# Patient Record
Sex: Male | Born: 1980
Health system: Southern US, Community
[De-identification: ages and names within clinical notes are randomized; demographics above are authoritative.]

## PROBLEM LIST (undated history)

## (undated) DIAGNOSIS — E782 Mixed hyperlipidemia: Secondary | ICD-10-CM

## (undated) DIAGNOSIS — Z6820 Body mass index (BMI) 20.0-20.9, adult: Secondary | ICD-10-CM

## (undated) DIAGNOSIS — F4321 Adjustment disorder with depressed mood: Secondary | ICD-10-CM

## (undated) DIAGNOSIS — F17200 Nicotine dependence, unspecified, uncomplicated: Secondary | ICD-10-CM

## (undated) DIAGNOSIS — I10 Essential (primary) hypertension: Secondary | ICD-10-CM

## (undated) DIAGNOSIS — F411 Generalized anxiety disorder: Secondary | ICD-10-CM

## (undated) HISTORY — DX: Mixed hyperlipidemia: E78.2

## (undated) HISTORY — DX: Generalized anxiety disorder: F41.1

## (undated) HISTORY — DX: Essential (primary) hypertension: I10

## (undated) HISTORY — PX: OTHER SURGICAL HISTORY: SHX169

## (undated) HISTORY — DX: Nicotine dependence, unspecified, uncomplicated: F17.200

## (undated) HISTORY — DX: Adjustment disorder with depressed mood: F43.21

## (undated) HISTORY — DX: Body mass index (BMI) 20.0-20.9, adult: Z68.20

---

## 2002-10-09 ENCOUNTER — Encounter: Admission: RE | Admit: 2002-10-09 | Discharge: 2002-10-09 | Payer: Self-pay | Admitting: Psychiatry

## 2002-10-28 ENCOUNTER — Encounter: Admission: RE | Admit: 2002-10-28 | Discharge: 2002-10-28 | Payer: Self-pay | Admitting: Psychiatry

## 2005-10-26 ENCOUNTER — Encounter: Admission: RE | Admit: 2005-10-26 | Discharge: 2005-10-26 | Payer: Self-pay | Admitting: Emergency Medicine

## 2006-05-16 DIAGNOSIS — K5792 Diverticulitis of intestine, part unspecified, without perforation or abscess without bleeding: Secondary | ICD-10-CM

## 2006-05-16 HISTORY — DX: Diverticulitis of intestine, part unspecified, without perforation or abscess without bleeding: K57.92

## 2014-05-08 ENCOUNTER — Inpatient Hospital Stay (HOSPITAL_COMMUNITY)
Admission: EM | Admit: 2014-05-08 | Discharge: 2014-05-10 | DRG: 872 | Disposition: A | Discharge status: Home / Self Care | Payer: PRIVATE HEALTH INSURANCE | Attending: Internal Medicine | Admitting: Internal Medicine

## 2014-05-08 ENCOUNTER — Emergency Department (HOSPITAL_COMMUNITY): Payer: PRIVATE HEALTH INSURANCE

## 2014-05-08 ENCOUNTER — Encounter (HOSPITAL_COMMUNITY): Payer: Self-pay | Admitting: *Deleted

## 2014-05-08 DIAGNOSIS — I471 Supraventricular tachycardia: Secondary | ICD-10-CM | POA: Diagnosis present

## 2014-05-08 DIAGNOSIS — R509 Fever, unspecified: Secondary | ICD-10-CM

## 2014-05-08 DIAGNOSIS — F17220 Nicotine dependence, chewing tobacco, uncomplicated: Secondary | ICD-10-CM | POA: Diagnosis present

## 2014-05-08 DIAGNOSIS — A419 Sepsis, unspecified organism: Secondary | ICD-10-CM | POA: Diagnosis present

## 2014-05-08 DIAGNOSIS — R112 Nausea with vomiting, unspecified: Secondary | ICD-10-CM | POA: Diagnosis present

## 2014-05-08 DIAGNOSIS — R109 Unspecified abdominal pain: Secondary | ICD-10-CM | POA: Insufficient documentation

## 2014-05-08 DIAGNOSIS — Z23 Encounter for immunization: Secondary | ICD-10-CM | POA: Diagnosis not present

## 2014-05-08 DIAGNOSIS — R000 Tachycardia, unspecified: Secondary | ICD-10-CM

## 2014-05-08 LAB — CBC WITH DIFFERENTIAL/PLATELET
BASOS ABS: 0 10*3/uL (ref 0.0–0.1)
BASOS PCT: 0 % (ref 0–1)
EOS PCT: 0 % (ref 0–5)
Eosinophils Absolute: 0 10*3/uL (ref 0.0–0.7)
HEMATOCRIT: 48.6 % (ref 39.0–52.0)
HEMOGLOBIN: 16.3 g/dL (ref 13.0–17.0)
LYMPHS PCT: 7 % — AB (ref 12–46)
Lymphs Abs: 0.7 10*3/uL (ref 0.7–4.0)
MCH: 28.8 pg (ref 26.0–34.0)
MCHC: 33.5 g/dL (ref 30.0–36.0)
MCV: 85.9 fL (ref 78.0–100.0)
MONO ABS: 0.4 10*3/uL (ref 0.1–1.0)
Monocytes Relative: 4 % (ref 3–12)
NEUTROS ABS: 9.3 10*3/uL — AB (ref 1.7–7.7)
NEUTROS PCT: 89 % — AB (ref 43–77)
PLATELETS: 166 10*3/uL (ref 150–400)
RBC: 5.66 MIL/uL (ref 4.22–5.81)
RDW: 13 % (ref 11.5–15.5)
WBC: 10.4 10*3/uL (ref 4.0–10.5)

## 2014-05-08 LAB — RAPID URINE DRUG SCREEN, HOSP PERFORMED
Amphetamines: NOT DETECTED
BARBITURATES: NOT DETECTED
BENZODIAZEPINES: NOT DETECTED
COCAINE: NOT DETECTED
OPIATES: NOT DETECTED
Tetrahydrocannabinol: NOT DETECTED

## 2014-05-08 LAB — COMPREHENSIVE METABOLIC PANEL
ALBUMIN: 3.8 g/dL (ref 3.5–5.2)
ALT: 28 U/L (ref 0–53)
ANION GAP: 8 (ref 5–15)
AST: 23 U/L (ref 0–37)
Alkaline Phosphatase: 61 U/L (ref 39–117)
BUN: 16 mg/dL (ref 6–23)
CALCIUM: 8.5 mg/dL (ref 8.4–10.5)
CHLORIDE: 105 meq/L (ref 96–112)
CO2: 26 mmol/L (ref 19–32)
Creatinine, Ser: 1.19 mg/dL (ref 0.50–1.35)
GFR, EST NON AFRICAN AMERICAN: 78 mL/min — AB (ref >90)
Glucose, Bld: 113 mg/dL — ABNORMAL HIGH (ref 70–99)
Potassium: 4.4 mmol/L (ref 3.5–5.1)
Sodium: 139 mmol/L (ref 135–145)
Total Bilirubin: 0.7 mg/dL (ref 0.3–1.2)
Total Protein: 6.7 g/dL (ref 6.0–8.3)

## 2014-05-08 LAB — URINALYSIS, ROUTINE W REFLEX MICROSCOPIC
Bilirubin Urine: NEGATIVE
Glucose, UA: NEGATIVE mg/dL
Hgb urine dipstick: NEGATIVE
KETONES UR: NEGATIVE mg/dL
LEUKOCYTES UA: NEGATIVE
NITRITE: NEGATIVE
PROTEIN: NEGATIVE mg/dL
Specific Gravity, Urine: 1.016 (ref 1.005–1.030)
UROBILINOGEN UA: 1 mg/dL (ref 0.0–1.0)
pH: 7.5 (ref 5.0–8.0)

## 2014-05-08 LAB — I-STAT CG4 LACTIC ACID, ED: Lactic Acid, Venous: 2.44 mmol/L — ABNORMAL HIGH (ref 0.5–2.2)

## 2014-05-08 LAB — MRSA PCR SCREENING: MRSA by PCR: NEGATIVE

## 2014-05-08 LAB — I-STAT TROPONIN, ED: TROPONIN I, POC: 0 ng/mL (ref 0.00–0.08)

## 2014-05-08 LAB — LIPASE, BLOOD: Lipase: 33 U/L (ref 11–59)

## 2014-05-08 LAB — TSH: TSH: 4.807 u[IU]/mL — AB (ref 0.350–4.500)

## 2014-05-08 LAB — LACTIC ACID, PLASMA: LACTIC ACID, VENOUS: 1.3 mmol/L (ref 0.5–2.2)

## 2014-05-08 LAB — T4, FREE: FREE T4: 1.12 ng/dL (ref 0.80–1.80)

## 2014-05-08 LAB — ETHANOL: Alcohol, Ethyl (B): <5 mg/dL (ref 0–9)

## 2014-05-08 MED ORDER — ACETAMINOPHEN 325 MG PO TABS
650.0000 mg | ORAL_TABLET | Freq: Four times a day (QID) | ORAL | Status: DC | PRN
  Administered 2014-05-08 – 2014-05-09 (×2): 650 mg via ORAL
  Filled 2014-05-08 (×2): qty 2

## 2014-05-08 MED ORDER — SODIUM CHLORIDE 0.9 % IV SOLN
INTRAVENOUS | Status: DC
  Administered 2014-05-08: 16:00:00 via INTRAVENOUS
  Administered 2014-05-09: 1000 mL via INTRAVENOUS
  Administered 2014-05-09 – 2014-05-10 (×2): via INTRAVENOUS

## 2014-05-08 MED ORDER — ONDANSETRON HCL 4 MG PO TABS: 4.0000 mg | ORAL_TABLET | Freq: Four times a day (QID) | ORAL | Status: DC | PRN

## 2014-05-08 MED ORDER — VANCOMYCIN HCL IN DEXTROSE 1-5 GM/200ML-% IV SOLN
1000.0000 mg | Freq: Two times a day (BID) | INTRAVENOUS | Status: DC
  Administered 2014-05-08: 1000 mg via INTRAVENOUS
  Filled 2014-05-08 (×3): qty 200

## 2014-05-08 MED ORDER — ONDANSETRON HCL 4 MG/2ML IJ SOLN
4.0000 mg | Freq: Once | INTRAMUSCULAR | Status: AC
  Administered 2014-05-08: 4 mg via INTRAVENOUS
  Filled 2014-05-08: qty 2

## 2014-05-08 MED ORDER — INFLUENZA VAC SPLIT QUAD 0.5 ML IM SUSY
0.5000 mL | PREFILLED_SYRINGE | INTRAMUSCULAR | Status: AC
Start: 2014-05-09 — End: 2014-05-09
  Administered 2014-05-09: 0.5 mL via INTRAMUSCULAR
  Filled 2014-05-08: qty 0.5

## 2014-05-08 MED ORDER — ACETAMINOPHEN 650 MG RE SUPP: 650.0000 mg | Freq: Four times a day (QID) | RECTAL | Status: DC | PRN

## 2014-05-08 MED ORDER — VANCOMYCIN HCL IN DEXTROSE 1-5 GM/200ML-% IV SOLN
1000.0000 mg | INTRAVENOUS | Status: AC
  Administered 2014-05-08: 1000 mg via INTRAVENOUS
  Filled 2014-05-08: qty 200

## 2014-05-08 MED ORDER — ONDANSETRON HCL 4 MG/2ML IJ SOLN
4.0000 mg | Freq: Four times a day (QID) | INTRAMUSCULAR | Status: DC | PRN
  Administered 2014-05-08: 4 mg via INTRAVENOUS
  Filled 2014-05-08: qty 2

## 2014-05-08 MED ORDER — OXYCODONE HCL 5 MG PO TABS
5.0000 mg | ORAL_TABLET | ORAL | Status: DC | PRN
  Administered 2014-05-10: 5 mg via ORAL
  Filled 2014-05-08: qty 1

## 2014-05-08 MED ORDER — IBUPROFEN 600 MG PO TABS
600.0000 mg | ORAL_TABLET | Freq: Four times a day (QID) | ORAL | Status: DC | PRN
  Administered 2014-05-08 – 2014-05-09 (×3): 600 mg via ORAL
  Filled 2014-05-08 (×6): qty 1

## 2014-05-08 MED ORDER — METOCLOPRAMIDE HCL 5 MG/ML IJ SOLN
10.0000 mg | Freq: Once | INTRAMUSCULAR | Status: AC
  Administered 2014-05-08: 10 mg via INTRAVENOUS
  Filled 2014-05-08: qty 2

## 2014-05-08 MED ORDER — IOHEXOL 350 MG/ML SOLN
100.0000 mL | Freq: Once | INTRAVENOUS | Status: AC | PRN
  Administered 2014-05-08: 100 mL via INTRAVENOUS

## 2014-05-08 MED ORDER — ALUM & MAG HYDROXIDE-SIMETH 200-200-20 MG/5ML PO SUSP: 30.0000 mL | Freq: Four times a day (QID) | ORAL | Status: DC | PRN

## 2014-05-08 MED ORDER — DEXTROSE 5 % IV SOLN
2.0000 g | INTRAVENOUS | Status: AC
  Administered 2014-05-08: 2 g via INTRAVENOUS
  Filled 2014-05-08: qty 2

## 2014-05-08 MED ORDER — SODIUM CHLORIDE 0.9 % IV BOLUS (SEPSIS)
1000.0000 mL | Freq: Once | INTRAVENOUS | Status: AC
  Administered 2014-05-08: 1000 mL via INTRAVENOUS

## 2014-05-08 MED ORDER — ACETAMINOPHEN 325 MG PO TABS
650.0000 mg | ORAL_TABLET | Freq: Once | ORAL | Status: AC
  Administered 2014-05-08: 650 mg via ORAL
  Filled 2014-05-08: qty 2

## 2014-05-08 MED ORDER — SODIUM CHLORIDE 0.9 % IV SOLN: INTRAVENOUS | Status: DC

## 2014-05-08 MED ORDER — ENOXAPARIN SODIUM 60 MG/0.6ML ~~LOC~~ SOLN
50.0000 mg | SUBCUTANEOUS | Status: DC
  Administered 2014-05-08 – 2014-05-09 (×2): 50 mg via SUBCUTANEOUS
  Filled 2014-05-08 (×3): qty 0.6

## 2014-05-08 MED ORDER — DEXTROSE 5 % IV SOLN
2.0000 g | Freq: Three times a day (TID) | INTRAVENOUS | Status: DC
  Administered 2014-05-08 – 2014-05-09 (×3): 2 g via INTRAVENOUS
  Filled 2014-05-08 (×5): qty 2

## 2014-05-08 NOTE — ED Notes (Signed)
Pt was given 4 mg Zofran. Nausea relieved.

## 2014-05-08 NOTE — Progress Notes (Signed)
Pt arrived to unit accompanied by RN. Pt oriented to room/unit. VS stable. 

## 2014-05-08 NOTE — H&P (Signed)
[facility] Admission History and Physical       Kenneth Hernandez ZOX:[id number] DOB: 07/11/1980 DOA: 05/08/2014  Referring physician: EDP PCP: No primary care provider on file.  Specialists:   Chief Complaint: Fever and Flank Pain with Nausea and Vomiting  HPI: Kenneth Hernandez is a 33 y.o. male who presents to the ED with complaints of Fever and Chills and Bilateral Flank PAin with nausea and Vomiting that started at 9 pm.  He denies any diarrhea.  His daughter subsequently became ill a few hours later .    His heart rate was found to be in the 140's , on Route to the ED he was administered adenosine x2, without improvement.  He   was administered IVF and Anti-Emetics in the ED and CT scan of ABD and Renal were performed and were negative for Renal stones or etiology.    Review of Systems:  Constitutional: No Weight Loss, No Weight Gain, Night Sweats, +Fevers, +Chills, Dizziness, Fatigue, or Generalized Weakness HEENT: No Headaches, Difficulty Swallowing,Tooth/Dental Problems,Sore Throat,  No Sneezing, Rhinitis, Ear Ache, Nasal Congestion, or Post Nasal Drip,  Cardio-vascular:  No Chest pain, Orthopnea, PND, Edema in Lower Extremities, Anasarca, Dizziness, Palpitations  Resp: No Dyspnea, No DOE, No Productive Cough, No Non-Productive Cough, No Hemoptysis, No Wheezing.    GI: No Heartburn, Indigestion, +Abdominal Pain,+ Nausea, +Vomiting, Diarrhea, Hematemesis, Hematochezia, Melena, Change in Bowel Habits,  Loss of Appetite  GU: No Dysuria, Change in Color of Urine, No Urgency or Frequency, No Flank pain.  Musculoskeletal: No Joint Pain or Swelling, No Decreased Range of Motion, No Back Pain.  Neurologic: No Syncope, No Seizures, Muscle Weakness, Paresthesia, Vision Disturbance or Loss, No Diplopia, No Vertigo, No Difficulty Walking,  Skin: No Rash or Lesions. Psych: No Change in Mood or Affect, No Depression or Anxiety, No Memory loss, No Confusion, or Hallucinations   History reviewed.  No pertinent past medical history.    History reviewed. No pertinent past surgical history.     Prior to Admission medications   Medication Sig Start Date End Date Taking? Authorizing Provider  anti-nausea (EMETROL) solution Take 10 mLs by mouth every 15 (fifteen) minutes as needed for nausea or vomiting.   Yes Historical Provider, MD  beclomethasone (QVAR) 40 MCG/ACT inhaler Inhale 1 puff into the lungs as needed (for wheezing).   Yes Historical Provider, MD  dimenhyDRINATE (DRAMAMINE) 50 MG tablet Take 50 mg by mouth every 8 (eight) hours as needed for nausea.    Yes Historical Provider, MD  diphenhydrAMINE (BENADRYL) 25 MG tablet Take 25 mg by mouth every 6 (six) hours as needed for allergies.   Yes Historical Provider, MD      Allergies  Allergen Reactions  . Morphine Nausea And Vomiting  . Penicillins Other (See Comments)    unknown     Social History:  reports that he has never smoked. His smokeless tobacco use includes Chew. He reports that he drinks alcohol. He reports that he does not use illicit drugs.     History reviewed. No pertinent family history.     Physical Exam:  GEN:  Pleasant  33 y.o. male  examined  and in no acute distress; cooperative with exam Filed Vitals:   05/08/14 0715 05/08/14 0719 05/08/14 0730 05/08/14 0745  BP: 127/70 127/70 117/76 120/73  Pulse: 124 118 126 115  Temp:      TempSrc:      Resp: 24 22 21 24   Height:      Weight:  SpO2: 96% 94% 95% 95%   Blood pressure 120/73, pulse 115, temperature 101.4 F (38.6 C), temperature source Oral, resp. rate 24, height 5\' 10"  (1.778 m), weight 106.595 kg (235 lb), SpO2 95 %. PSYCH: He is alert and oriented x4; does not appear anxious does not appear depressed; affect is normal HEENT: Normocephalic and Atraumatic, Mucous membranes pink; PERRLA; EOM intact; Fundi:  Benign;  No scleral icterus, Nares: Patent, Oropharynx: Clear, Fair Dentition,    Neck:  FROM, No Cervical Lymphadenopathy nor  Thyromegaly or Carotid Bruit; No JVD; Breasts:: Not examined CHEST WALL: No tenderness CHEST: Normal respiration, clear to auscultation bilaterally HEART: Tachycardic Regular Rhythm, no murmurs rubs or gallops BACK: No kyphosis or scoliosis; No CVA tenderness ABDOMEN: Positive Bowel Sounds,  Soft Non-Tender; No Masses, No Organomegaly, No Pannus; No Intertriginous candida. Rectal Exam: Not done EXTREMITIES: No Cyanosis, Clubbing, or Edema; No Ulcerations. Genitalia: not examined PULSES: 2+ and symmetric SKIN: Normal hydration no rash or ulceration CNS:  NonFocal Vascular: pulses palpable throughout    Labs on Admission:  Basic Metabolic Panel:  Recent Labs Lab 05/08/14 0043  NA 139  K 4.4  CL 105  CO2 26  GLUCOSE 113*  BUN 16  CREATININE 1.19  CALCIUM 8.5   Liver Function Tests:  Recent Labs Lab 05/08/14 0043  AST 23  ALT 28  ALKPHOS 61  BILITOT 0.7  PROT 6.7  ALBUMIN 3.8    Recent Labs Lab 05/08/14 0043  LIPASE 33   No results for input(s): AMMONIA in the last 168 hours. CBC:  Recent Labs Lab 05/08/14 0043  WBC 10.4  NEUTROABS 9.3*  HGB 16.3  HCT 48.6  MCV 85.9  PLT 166   Cardiac Enzymes: No results for input(s): CKTOTAL, CKMB, CKMBINDEX, TROPONINI in the last 168 hours.  BNP (last 3 results) No results for input(s): PROBNP in the last 8760 hours. CBG: No results for input(s): GLUCAP in the last 168 hours.  Radiological Exams on Admission: Ct Angio Chest Pe W/cm &/or Wo Cm  05/08/2014   CLINICAL DATA:  Acute onset of nausea and vomiting. Supraventricular tachycardia. Bilateral lower back pain and fever. Initial encounter.  EXAM: CT ANGIOGRAPHY CHEST  CT ABDOMEN AND PELVIS WITHOUT CONTRAST  TECHNIQUE: Multidetector CT imaging of the chest was performed using the standard protocol during bolus administration of intravenous contrast. Multiplanar CT image reconstructions and MIPs were obtained to evaluate the vascular anatomy. Multidetector CT  imaging of the abdomen and pelvis was performed using the standard protocol, prior to the administration of intravenous contrast.  CONTRAST:  OMNIPAQUE IOHEXOL 350 MG/ML SOLN  COMPARISON:  CT of the abdomen and pelvis from 01/14/2013, and chest radiograph performed earlier today at 12:39 a.m.  FINDINGS: CTA CHEST FINDINGS  There is no evidence of pulmonary embolus.  The lungs are essentially clear bilaterally. There is no evidence of significant focal consolidation, pleural effusion or pneumothorax. No masses are identified; no abnormal focal contrast enhancement is seen.  The mediastinum is unremarkable in appearance. No mediastinal lymphadenopathy is seen. No pericardial effusion is identified. The great vessels are grossly unremarkable in appearance. No axillary lymphadenopathy is seen. The visualized portions of the thyroid gland are unremarkable in appearance.  No acute osseous abnormalities are seen.  CT ABDOMEN and PELVIS FINDINGS  The liver and spleen are unremarkable in appearance. The gallbladder is within normal limits. The pancreas and adrenal glands are unremarkable.  The kidneys are unremarkable in appearance. There is no evidence of hydronephrosis. No renal  or ureteral stones are seen. Minimal nonspecific perinephric stranding is noted bilaterally.  No free fluid is identified. The small bowel is unremarkable in appearance. The stomach is within normal limits. No acute vascular abnormalities are seen.  The appendix is normal in caliber and contains air, without evidence for appendicitis. The colon is unremarkable in appearance.  The bladder is mildly distended and grossly unremarkable. A small urachal remnant is incidentally seen. The prostate is normal in size. No inguinal lymphadenopathy is seen.  No acute osseous abnormalities are identified.  Review of the MIP images confirms the above findings.  IMPRESSION: 1. No evidence of pulmonary embolus. Lungs remain clear bilaterally. 2. Unremarkable  noncontrast CT of the abdomen and pelvis.   Electronically Signed   By: Roanna RaiderJeffery  Chang M.D.   On: 05/08/2014 02:11   Dg Chest Port 1 View  05/08/2014   CLINICAL DATA:  Acute onset of generalized chest pain and shortness of breath. Tachycardia. Initial encounter.  EXAM: PORTABLE CHEST - 1 VIEW  COMPARISON:  Chest radiograph performed 01/14/2013  FINDINGS: The lungs are well-aerated and clear. There is no evidence of focal opacification, pleural effusion or pneumothorax.  The cardiomediastinal silhouette is within normal limits. No acute osseous abnormalities are seen.  IMPRESSION: No acute cardiopulmonary process seen.   Electronically Signed   By: Roanna RaiderJeffery  Chang M.D.   On: 05/08/2014 01:13   Ct Renal Stone Study  05/08/2014   CLINICAL DATA:  Acute onset of nausea and vomiting. Supraventricular tachycardia. Bilateral lower back pain and fever. Initial encounter.  EXAM: CT ANGIOGRAPHY CHEST  CT ABDOMEN AND PELVIS WITHOUT CONTRAST  TECHNIQUE: Multidetector CT imaging of the chest was performed using the standard protocol during bolus administration of intravenous contrast. Multiplanar CT image reconstructions and MIPs were obtained to evaluate the vascular anatomy. Multidetector CT imaging of the abdomen and pelvis was performed using the standard protocol, prior to the administration of intravenous contrast.  CONTRAST:  100mL OMNIPAQUE IOHEXOL 350 MG/ML SOLN  COMPARISON:  CT of the abdomen and pelvis from 01/14/2013, and chest radiograph performed earlier today at 12:39 a.m.  FINDINGS: CTA CHEST FINDINGS  There is no evidence of pulmonary embolus.  The lungs are essentially clear bilaterally. There is no evidence of significant focal consolidation, pleural effusion or pneumothorax. No masses are identified; no abnormal focal contrast enhancement is seen.  The mediastinum is unremarkable in appearance. No mediastinal lymphadenopathy is seen. No pericardial effusion is identified. The great vessels are grossly  unremarkable in appearance. No axillary lymphadenopathy is seen. The visualized portions of the thyroid gland are unremarkable in appearance.  No acute osseous abnormalities are seen.  CT ABDOMEN and PELVIS FINDINGS  The liver and spleen are unremarkable in appearance. The gallbladder is within normal limits. The pancreas and adrenal glands are unremarkable.  The kidneys are unremarkable in appearance. There is no evidence of hydronephrosis. No renal or ureteral stones are seen. Minimal nonspecific perinephric stranding is noted bilaterally.  No free fluid is identified. The small bowel is unremarkable in appearance. The stomach is within normal limits. No acute vascular abnormalities are seen.  The appendix is normal in caliber and contains air, without evidence for appendicitis. The colon is unremarkable in appearance.  The bladder is mildly distended and grossly unremarkable. A small urachal remnant is incidentally seen. The prostate is normal in size. No inguinal lymphadenopathy is seen.  No acute osseous abnormalities are identified.  Review of the MIP images confirms the above findings.  IMPRESSION: 1. No  evidence of pulmonary embolus. Lungs remain clear bilaterally. 2. Unremarkable noncontrast CT of the abdomen and pelvis.   Electronically Signed   By: Roanna Raider M.D.   On: 05/08/2014 02:11     EKG: Independently reviewed. Sinus Tachycardia at 145   Assessment/Plan:   33 y.o. male with  Principal Problem:   1.    Sepsis/ Febrile illness   Blood Cultures x2, Urine Cx sent   IV Vancomycin and Cefepime   IVFs      Active Problems:   2.   Sinus tachycardia- Due to Sepsis   Check TSH   IVFs      3.   Nausea and vomiting   PRN Anti-Emetics           Code Status:  FULL CODE     Family Communication:  No Family Present    Disposition Plan:    Inpatient  Time spent:   58 Minutes  Ron Parker [facility] Pager [phone]   If 7AM -7PM Please Contact the Day  Rounding Team MD for [facility]  If 7PM-7AM, Please Contact Night-Floor Coverage  [email or web address] Password [email or web address] 05/08/2014, 8:14 AM

## 2014-05-08 NOTE — ED Notes (Signed)
Patient transported to CT 

## 2014-05-08 NOTE — ED Provider Notes (Signed)
CSN: [id number]     Arrival date & time 05/08/14  0028 History  This chart was scribed for Kenneth CrumbleAdeleke Abubakar Crispo, MD by Evon Slackerrance Branch, ED Scribe. [facility] and the patient's care was started at 12:38 AM.      Chief Complaint  Patient presents with  . Tachycardia    HPI HPI Comments: Kenneth Hernandez is a 33 y.o. male brought in by ambulance, who presents to the Emergency Department complaining of tachycardia onset PTA. Pt stats that he called EMS for nausea and vomiting onset about 5 hours prior. Pt states that his symptoms began yesterday with low back pain and LLQ abdominal pain. Pt states he has a fever, feeling SOB and light headed. Pt states that he has recently had a lot of increase of stress in his life due to christmas being less than 24 hours away, new job and not having health insurance. EMS states that medications given in route was not providing any relief. Pt denies Hx of DVT's. Pt states that his PCP was suspicious of Kidney stone about 2 weeks prior but didn't do any further workup. Pt also states that his wife was sick with the flu about 2 weeks prior. Pt denies dysuria, hematuria or hemoptysis. Pt states the only medical Hx he has is an asthma cough that he takes Qvar as needed.   Upon EMS arrival, they gave him 2 rounds of adenosine which did not break his rhythm. They believe he was in SVT. Fever at home was 101.7.    No past medical history on file. No past surgical history on file. No family history on file. History  Substance Use Topics  . Smoking status: Not on file  . Smokeless tobacco: Not on file  . Alcohol Use: Not on file    Review of Systems  Constitutional: Positive for fever.  Gastrointestinal: Positive for nausea and vomiting.  Genitourinary: Negative for dysuria and hematuria.  Musculoskeletal: Positive for back pain.  All other systems reviewed and are negative.     Allergies  Review of patient's allergies indicates not on  file.  Home Medications   Prior to Admission medications   Not on File   Triage Vitals: BP 130/90 mmHg  Pulse 149  Temp(Src) 100 F (37.8 C) (Oral)  Resp 20  Ht 5\' 10"  (1.778 m)  Wt 235 lb (106.595 kg)  BMI 33.72 kg/m2  SpO2 97%   Physical Exam  Constitutional: He is oriented to person, place, and time. Vital signs are normal. He appears well-developed and well-nourished.  Non-toxic appearance. He does not appear ill. No distress.  HENT:  Head: Normocephalic and atraumatic.  Nose: Nose normal.  Mouth/Throat: Oropharynx is clear and moist. No oropharyngeal exudate.  Eyes: Conjunctivae and EOM are normal. Pupils are equal, round, and reactive to light. No scleral icterus.  Neck: Normal range of motion. Neck supple. No tracheal deviation, no edema, no erythema and normal range of motion present. No thyroid mass and no thyromegaly present.  Cardiovascular: Regular rhythm, S1 normal, S2 normal, normal heart sounds, intact distal pulses and normal pulses.  Tachycardia present.  Exam reveals no gallop and no friction rub.   No murmur heard. Pulses:      Radial pulses are 2+ on the right side, and 2+ on the left side.       Dorsalis pedis pulses are 2+ on the right side, and 2+ on the left side.  Pulmonary/Chest: Effort normal and breath sounds normal.  No respiratory distress. He has no wheezes. He has no rhonchi. He has no rales.  Abdominal: Soft. Normal appearance and bowel sounds are normal. He exhibits no distension, no ascites and no mass. There is no hepatosplenomegaly. There is no tenderness. There is no rebound, no guarding and no CVA tenderness.  Musculoskeletal: Normal range of motion. He exhibits no edema or tenderness.  Lymphadenopathy:    He has no cervical adenopathy.  Neurological: He is alert and oriented to person, place, and time. He has normal strength. No cranial nerve deficit or sensory deficit. GCS eye subscore is 4. GCS verbal subscore is 5. GCS motor subscore is 6.   Skin: Skin is warm, dry and intact. No petechiae and no rash noted. He is not diaphoretic. No erythema. No pallor.  Psychiatric: He has a normal mood and affect. His behavior is normal. Judgment normal.  Nursing note and vitals reviewed.   ED Course  Procedures (including critical care time) DIAGNOSTIC STUDIES: Oxygen Saturation is 97% on RA, normal by my interpretation.    COORDINATION OF CARE: 1:00 AM-Discussed treatment plan with pt at bedside and pt agreed to plan.     Labs Review Labs Reviewed  CBC WITH DIFFERENTIAL - Abnormal; Notable for the following:    Neutrophils Relative % 89 (*)    Neutro Abs 9.3 (*)    Lymphocytes Relative 7 (*)    All other components within normal limits  COMPREHENSIVE METABOLIC PANEL - Abnormal; Notable for the following:    Glucose, Bld 113 (*)    GFR calc non Af Amer 78 (*)    All other components within normal limits  TSH - Abnormal; Notable for the following:    TSH 4.807 (*)    All other components within normal limits  I-STAT CG4 LACTIC ACID, ED - Abnormal; Notable for the following:    Lactic Acid, Venous 2.44 (*)    All other components within normal limits  URINE CULTURE  LIPASE, BLOOD  URINALYSIS, ROUTINE W REFLEX MICROSCOPIC  ETHANOL  URINE RAPID DRUG SCREEN (HOSP PERFORMED)  T4, FREE  I-STAT TROPOININ, ED    Imaging Review Ct Angio Chest Pe W/cm &/or Wo Cm  05/08/2014   CLINICAL DATA:  Acute onset of nausea and vomiting. Supraventricular tachycardia. Bilateral lower back pain and fever. Initial encounter.  EXAM: CT ANGIOGRAPHY CHEST  CT ABDOMEN AND PELVIS WITHOUT CONTRAST  TECHNIQUE: Multidetector CT imaging of the chest was performed using the standard protocol during bolus administration of intravenous contrast. Multiplanar CT image reconstructions and MIPs were obtained to evaluate the vascular anatomy. Multidetector CT imaging of the abdomen and pelvis was performed using the standard protocol, prior to the administration  of intravenous contrast.  CONTRAST:  100mL OMNIPAQUE IOHEXOL 350 MG/ML SOLN  COMPARISON:  CT of the abdomen and pelvis from 01/14/2013, and chest radiograph performed earlier today at 12:39 a.m.  FINDINGS: CTA CHEST FINDINGS  There is no evidence of pulmonary embolus.  The lungs are essentially clear bilaterally. There is no evidence of significant focal consolidation, pleural effusion or pneumothorax. No masses are identified; no abnormal focal contrast enhancement is seen.  The mediastinum is unremarkable in appearance. No mediastinal lymphadenopathy is seen. No pericardial effusion is identified. The great vessels are grossly unremarkable in appearance. No axillary lymphadenopathy is seen. The visualized portions of the thyroid gland are unremarkable in appearance.  No acute osseous abnormalities are seen.  CT ABDOMEN and PELVIS FINDINGS  The liver and spleen are unremarkable in appearance.  The gallbladder is within normal limits. The pancreas and adrenal glands are unremarkable.  The kidneys are unremarkable in appearance. There is no evidence of hydronephrosis. No renal or ureteral stones are seen. Minimal nonspecific perinephric stranding is noted bilaterally.  No free fluid is identified. The small bowel is unremarkable in appearance. The stomach is within normal limits. No acute vascular abnormalities are seen.  The appendix is normal in caliber and contains air, without evidence for appendicitis. The colon is unremarkable in appearance.  The bladder is mildly distended and grossly unremarkable. A small urachal remnant is incidentally seen. The prostate is normal in size. No inguinal lymphadenopathy is seen.  No acute osseous abnormalities are identified.  Review of the MIP images confirms the above findings.  IMPRESSION: 1. No evidence of pulmonary embolus. Lungs remain clear bilaterally. 2. Unremarkable noncontrast CT of the abdomen and pelvis.   Electronically Signed   By: Roanna Raider M.D.   On:  05/08/2014 02:11   Dg Chest Port 1 View  05/08/2014   CLINICAL DATA:  Acute onset of generalized chest pain and shortness of breath. Tachycardia. Initial encounter.  EXAM: PORTABLE CHEST - 1 VIEW  COMPARISON:  Chest radiograph performed 01/14/2013  FINDINGS: The lungs are well-aerated and clear. There is no evidence of focal opacification, pleural effusion or pneumothorax.  The cardiomediastinal silhouette is within normal limits. No acute osseous abnormalities are seen.  IMPRESSION: No acute cardiopulmonary process seen.   Electronically Signed   By: Roanna Raider M.D.   On: 05/08/2014 01:13   Ct Renal Stone Study  05/08/2014   CLINICAL DATA:  Acute onset of nausea and vomiting. Supraventricular tachycardia. Bilateral lower back pain and fever. Initial encounter.  EXAM: CT ANGIOGRAPHY CHEST  CT ABDOMEN AND PELVIS WITHOUT CONTRAST  TECHNIQUE: Multidetector CT imaging of the chest was performed using the standard protocol during bolus administration of intravenous contrast. Multiplanar CT image reconstructions and MIPs were obtained to evaluate the vascular anatomy. Multidetector CT imaging of the abdomen and pelvis was performed using the standard protocol, prior to the administration of intravenous contrast.  CONTRAST:  OMNIPAQUE IOHEXOL 350 MG/ML SOLN  COMPARISON:  CT of the abdomen and pelvis from 01/14/2013, and chest radiograph performed earlier today at 12:39 a.m.  FINDINGS: CTA CHEST FINDINGS  There is no evidence of pulmonary embolus.  The lungs are essentially clear bilaterally. There is no evidence of significant focal consolidation, pleural effusion or pneumothorax. No masses are identified; no abnormal focal contrast enhancement is seen.  The mediastinum is unremarkable in appearance. No mediastinal lymphadenopathy is seen. No pericardial effusion is identified. The great vessels are grossly unremarkable in appearance. No axillary lymphadenopathy is seen. The visualized portions of the  thyroid gland are unremarkable in appearance.  No acute osseous abnormalities are seen.  CT ABDOMEN and PELVIS FINDINGS  The liver and spleen are unremarkable in appearance. The gallbladder is within normal limits. The pancreas and adrenal glands are unremarkable.  The kidneys are unremarkable in appearance. There is no evidence of hydronephrosis. No renal or ureteral stones are seen. Minimal nonspecific perinephric stranding is noted bilaterally.  No free fluid is identified. The small bowel is unremarkable in appearance. The stomach is within normal limits. No acute vascular abnormalities are seen.  The appendix is normal in caliber and contains air, without evidence for appendicitis. The colon is unremarkable in appearance.  The bladder is mildly distended and grossly unremarkable. A small urachal remnant is incidentally seen. The prostate is normal  in size. No inguinal lymphadenopathy is seen.  No acute osseous abnormalities are identified.  Review of the MIP images confirms the above findings.  IMPRESSION: 1. No evidence of pulmonary embolus. Lungs remain clear bilaterally. 2. Unremarkable noncontrast CT of the abdomen and pelvis.   Electronically Signed   By: Roanna Raider M.D.   On: 05/08/2014 02:11     EKG Interpretation   Date/Time:  Thursday May 08 2014 00:30:07 EST Ventricular Rate:  157 PR Interval:  105 QRS Duration: 79 QT Interval:  269 QTC Calculation: 435 R Axis:   67 Text Interpretation:  Sinus tachycardia Probable left atrial enlargement  Confirmed by Erroll Luna [phone]) on 05/08/2014 1:09:27 AM      MDM   Final diagnoses:  None      Patient presents emergency department for sudden onset flank pain with nausea and vomiting. Upon EMS arrival his heart rate was in the 160s. Due to his sudden onset of symptoms I have concern for nephrolithiasis, CT scan of the abdomen however was negative. CTA for pulmonary embolism was also negative. He was given 1 L of IV  fluids.  His heart rate continues to stay in the 140s to 150s, sinus tachycardia.  I did not see any evidence from EMS EKGs that the patient was in SVT. At this time I do not know what is causing the patient's tachycardia or fever in the emergency department. He continues to be symptomatic. Patient be admitted to [facility], continued evaluation. Thyroid studies are currently pending.  I personally performed the services described in this documentation, which was scribed in my presence. The recorded information has been reviewed and is accurate.      Kenneth Crumble, MD 05/08/14 [phone]

## 2014-05-08 NOTE — Progress Notes (Signed)
[facility] PROGRESS NOTE  Kenneth Hernandez ZOX:[id number] DOB: 01/16/1981 DOA: 05/08/2014 PCP: No primary care provider on file.  Assessment/Plan: 1. Sepsis/SIRS -Present on admission, evidenced by a heart rate in the 130s, respiratory rate of 28, temperature of 101.4, lactate of 2.44. -Unclear source of infection at this point, imaging studies of lungs and abdomen not revealing acute pathology. -Nursing staff did report multiple episodes of nonbloody diarrhea today. -Blood cultures obtained on admission and are pending at the time of this dictation -Urinalysis unremarkable as there was no CT evidence of hydronephrosis or stones -He does not appear to be lethargic or having mental status changes to suggest central nervous system pathology. Nuchal rigidity was not appreciated on physical exam -Will continue aggressive IV fluid hydration, empiric IV antimicrobial therapy with cefepime and vancomycin, supportive care   Code Status: FULL CODE Family Communication: Spoke with his wife who was present at bedside Disposition Plan: Close monitoring in the step down unit   Antibiotics:  Vancomycin  Cefepime  HPI/Subjective: Patient is a pleasant 33 year old gentleman witha thickened past medical history who was admitted to the medicine service on 05/08/2014. He presented with complaints of fevers, chills, malaise, generalized weakness along with bilateral flank pain. He was found to be ill-appearing, tachycardic having a pulse of 138 with an increased respiratory to 28. Labs were unremarkable however with white count of 10.4, chemistries within normal limits. Chest x-ray did not reveal acute cardiopulmonary process. CT scan of lungs were negative for pulmonary embolism. The lungs remain clear bilaterally no evidence of pneumonia. He had a noncontrast abdominal CT which did not show evidence of acute intra-abdominal pathology. His kidneys were unremarkable in appearance as there is no evidence of  hydronephrosis nor was her evidence of renal or ureteral stones. Blood cultures were obtained, he was started on empiric IV antimicrobial therapy with cefepime and vancomycin.  Objective: Filed Vitals:   05/08/14 1316  BP: 117/81  Pulse: 139  Temp: 99.4 F (37.4 C)  Resp: 17    Intake/Output Summary (Last 24 hours) at 05/08/14 1438 Last data filed at 05/08/14 0200  Gross per 24 hour  Intake   1000 ml  Output      0 ml  Net   1000 ml   Filed Weights   05/08/14 0038 05/08/14 1316  Weight: 106.595 kg (235 lb) 105.8 kg (233 lb 4 oz)    Exam:   General:  Ill-appearing, toxic, actively having chills  Cardiovascular: Tachycardic, regular rate rhythm normal S1-S2  Respiratory: Normal respiratory effort, lungs are clear to auscultation bilaterally  Abdomen: Soft nontender nondistended  Musculoskeletal: No edema  Data Reviewed: Basic Metabolic Panel:  Recent Labs Lab 05/08/14 0043  NA 139  K 4.4  CL 105  CO2 26  GLUCOSE 113*  BUN 16  CREATININE 1.19  CALCIUM 8.5   Liver Function Tests:  Recent Labs Lab 05/08/14 0043  AST 23  ALT 28  ALKPHOS 61  BILITOT 0.7  PROT 6.7  ALBUMIN 3.8    Recent Labs Lab 05/08/14 0043  LIPASE 33   No results for input(s): AMMONIA in the last 168 hours. CBC:  Recent Labs Lab 05/08/14 0043  WBC 10.4  NEUTROABS 9.3*  HGB 16.3  HCT 48.6  MCV 85.9  PLT 166   Cardiac Enzymes: No results for input(s): CKTOTAL, CKMB, CKMBINDEX, TROPONINI in the last 168 hours. BNP (last 3 results) No results for input(s): PROBNP in the last 8760 hours. CBG: No results for input(s): GLUCAP in  the last 168 hours.  No results found for this or any previous visit (from the past 240 hour(s)).   Studies: Ct Angio Chest Pe W/cm &/or Wo Cm  05/08/2014   CLINICAL DATA:  Acute onset of nausea and vomiting. Supraventricular tachycardia. Bilateral lower back pain and fever. Initial encounter.  EXAM: CT ANGIOGRAPHY CHEST  CT ABDOMEN AND PELVIS  WITHOUT CONTRAST  TECHNIQUE: Multidetector CT imaging of the chest was performed using the standard protocol during bolus administration of intravenous contrast. Multiplanar CT image reconstructions and MIPs were obtained to evaluate the vascular anatomy. Multidetector CT imaging of the abdomen and pelvis was performed using the standard protocol, prior to the administration of intravenous contrast.  CONTRAST:  OMNIPAQUE IOHEXOL 350 MG/ML SOLN  COMPARISON:  CT of the abdomen and pelvis from 01/14/2013, and chest radiograph performed earlier today at 12:39 a.m.  FINDINGS: CTA CHEST FINDINGS  There is no evidence of pulmonary embolus.  The lungs are essentially clear bilaterally. There is no evidence of significant focal consolidation, pleural effusion or pneumothorax. No masses are identified; no abnormal focal contrast enhancement is seen.  The mediastinum is unremarkable in appearance. No mediastinal lymphadenopathy is seen. No pericardial effusion is identified. The great vessels are grossly unremarkable in appearance. No axillary lymphadenopathy is seen. The visualized portions of the thyroid gland are unremarkable in appearance.  No acute osseous abnormalities are seen.  CT ABDOMEN and PELVIS FINDINGS  The liver and spleen are unremarkable in appearance. The gallbladder is within normal limits. The pancreas and adrenal glands are unremarkable.  The kidneys are unremarkable in appearance. There is no evidence of hydronephrosis. No renal or ureteral stones are seen. Minimal nonspecific perinephric stranding is noted bilaterally.  No free fluid is identified. The small bowel is unremarkable in appearance. The stomach is within normal limits. No acute vascular abnormalities are seen.  The appendix is normal in caliber and contains air, without evidence for appendicitis. The colon is unremarkable in appearance.  The bladder is mildly distended and grossly unremarkable. A small urachal remnant is incidentally  seen. The prostate is normal in size. No inguinal lymphadenopathy is seen.  No acute osseous abnormalities are identified.  Review of the MIP images confirms the above findings.  IMPRESSION: 1. No evidence of pulmonary embolus. Lungs remain clear bilaterally. 2. Unremarkable noncontrast CT of the abdomen and pelvis.   Electronically Signed   By: Roanna Raider M.D.   On: 05/08/2014 02:11   Dg Chest Port 1 View  05/08/2014   CLINICAL DATA:  Acute onset of generalized chest pain and shortness of breath. Tachycardia. Initial encounter.  EXAM: PORTABLE CHEST - 1 VIEW  COMPARISON:  Chest radiograph performed 01/14/2013  FINDINGS: The lungs are well-aerated and clear. There is no evidence of focal opacification, pleural effusion or pneumothorax.  The cardiomediastinal silhouette is within normal limits. No acute osseous abnormalities are seen.  IMPRESSION: No acute cardiopulmonary process seen.   Electronically Signed   By: Roanna Raider M.D.   On: 05/08/2014 01:13   Ct Renal Stone Study  05/08/2014   CLINICAL DATA:  Acute onset of nausea and vomiting. Supraventricular tachycardia. Bilateral lower back pain and fever. Initial encounter.  EXAM: CT ANGIOGRAPHY CHEST  CT ABDOMEN AND PELVIS WITHOUT CONTRAST  TECHNIQUE: Multidetector CT imaging of the chest was performed using the standard protocol during bolus administration of intravenous contrast. Multiplanar CT image reconstructions and MIPs were obtained to evaluate the vascular anatomy. Multidetector CT imaging of the abdomen and  pelvis was performed using the standard protocol, prior to the administration of intravenous contrast.  CONTRAST:  100mL OMNIPAQUE IOHEXOL 350 MG/ML SOLN  COMPARISON:  CT of the abdomen and pelvis from 01/14/2013, and chest radiograph performed earlier today at 12:39 a.m.  FINDINGS: CTA CHEST FINDINGS  There is no evidence of pulmonary embolus.  The lungs are essentially clear bilaterally. There is no evidence of significant focal  consolidation, pleural effusion or pneumothorax. No masses are identified; no abnormal focal contrast enhancement is seen.  The mediastinum is unremarkable in appearance. No mediastinal lymphadenopathy is seen. No pericardial effusion is identified. The great vessels are grossly unremarkable in appearance. No axillary lymphadenopathy is seen. The visualized portions of the thyroid gland are unremarkable in appearance.  No acute osseous abnormalities are seen.  CT ABDOMEN and PELVIS FINDINGS  The liver and spleen are unremarkable in appearance. The gallbladder is within normal limits. The pancreas and adrenal glands are unremarkable.  The kidneys are unremarkable in appearance. There is no evidence of hydronephrosis. No renal or ureteral stones are seen. Minimal nonspecific perinephric stranding is noted bilaterally.  No free fluid is identified. The small bowel is unremarkable in appearance. The stomach is within normal limits. No acute vascular abnormalities are seen.  The appendix is normal in caliber and contains air, without evidence for appendicitis. The colon is unremarkable in appearance.  The bladder is mildly distended and grossly unremarkable. A small urachal remnant is incidentally seen. The prostate is normal in size. No inguinal lymphadenopathy is seen.  No acute osseous abnormalities are identified.  Review of the MIP images confirms the above findings.  IMPRESSION: 1. No evidence of pulmonary embolus. Lungs remain clear bilaterally. 2. Unremarkable noncontrast CT of the abdomen and pelvis.   Electronically Signed   By: Roanna RaiderJeffery  Chang M.D.   On: 05/08/2014 02:11    Scheduled Meds: . ceFEPime (MAXIPIME) IV  2 g Intravenous 3 times per day  . enoxaparin (LOVENOX) injection  50 mg Subcutaneous Q24H  . [START ON 05/09/2014] Influenza vac split quadrivalent PF  0.5 mL Intramuscular Tomorrow-1000  . vancomycin  1,000 mg Intravenous Q12H   Continuous Infusions: . sodium chloride      Principal  Problem:   Sepsis Active Problems:   Nausea and vomiting   Sinus tachycardia   Febrile illness    Time spent:     Jeralyn BennettZAMORA, Lonnetta Kniskern  [facility] Pager [phone]. If 7PM-7AM, please contact night-coverage at [email or web address], password [email or web address] 05/08/2014, 2:38 PM  LOS: 0 days

## 2014-05-08 NOTE — ED Notes (Signed)
Per EMS: pt called out for n/v, upon EMS arrival pt was in SVT HR 160, pt was given 12 mg of Adenosine, HR decreased to 70 for a couple of seconds, pt went back into SVT HR 160. Pt A&Ox4, respirations equal and unlabored, skin warm and dry. Pt also c/o bilateral lower back pain. Fever of 101.7 at home.

## 2014-05-08 NOTE — ED Notes (Signed)
Notified MD and RN of elevated i-stat Lactic Acid of 2.44

## 2014-05-08 NOTE — Progress Notes (Signed)
ANTIBIOTIC CONSULT NOTE - INITIAL  Pharmacy Consult for Vancomycin, Cefepime Indication: rule out sepsis  Allergies  Allergen Reactions  . Morphine Nausea And Vomiting  . Penicillins Other (See Comments)    unknown    Patient Measurements: Height: 5\' 10"  (177.8 cm) Weight: 235 lb (106.595 kg) IBW/kg (Calculated) : 73 Adjusted Body Weight:   Vital Signs: Temp: 98.8 F (37.1 C) (12/24 0815) Temp Source: Oral (12/24 0815) BP: 120/73 mmHg (12/24 0745) Pulse Rate: 115 (12/24 0745) Intake/Output from previous day: 12/23 0701 - 12/24 0700 In: 1000 [I.V.:1000] Out: -  Intake/Output from this shift:    Labs:  Recent Labs  05/08/14 0043  WBC 10.4  HGB 16.3  PLT 166  CREATININE 1.19   Estimated Creatinine Clearance: 105.9 mL/min (by C-G formula based on Cr of 1.19). No results for input(s): VANCOTROUGH, VANCOPEAK, VANCORANDOM, GENTTROUGH, GENTPEAK, GENTRANDOM, TOBRATROUGH, TOBRAPEAK, TOBRARND, AMIKACINPEAK, AMIKACINTROU, AMIKACIN in the last 72 hours.   Microbiology: No results found for this or any previous visit (from the past 720 hour(s)).  Medical History: History reviewed. No pertinent past medical history.  Medications:  Scheduled:  Assessment: 33yo male with c/o fever/chills, bilateral flank pain, and N/V; to start Vancomycin and Cefepime for R/O sepsis.  He has received no antibiotics thus far.  Blood and urine cx have been drawn.  Cr 1.19 with estimated CrCl ~72  Goal of Therapy:  Vancomycin trough level 15-20 mcg/ml  Plan:  - Vanc 1000mg  IV q12 (dosing per obesity nomogram) - Cefepime 2g IV q8 - F/U cx - VT at ss  Marisue HumbleKendra Antara Brecheisen, PharmD Clinical Pharmacist [facility]

## 2014-05-08 NOTE — ED Notes (Signed)
Pt is aware that urine is needed for testing, urinal at bedside. 

## 2014-05-09 LAB — CBC
HEMATOCRIT: 40.5 % (ref 39.0–52.0)
HEMOGLOBIN: 13.4 g/dL (ref 13.0–17.0)
MCH: 29.4 pg (ref 26.0–34.0)
MCHC: 33.1 g/dL (ref 30.0–36.0)
MCV: 88.8 fL (ref 78.0–100.0)
Platelets: 124 10*3/uL — ABNORMAL LOW (ref 150–400)
RBC: 4.56 MIL/uL (ref 4.22–5.81)
RDW: 13.8 % (ref 11.5–15.5)
WBC: 5.9 10*3/uL (ref 4.0–10.5)

## 2014-05-09 LAB — BASIC METABOLIC PANEL
ANION GAP: 7 (ref 5–15)
BUN: 11 mg/dL (ref 6–23)
CALCIUM: 7.2 mg/dL — AB (ref 8.4–10.5)
CHLORIDE: 109 meq/L (ref 96–112)
CO2: 22 mmol/L (ref 19–32)
Creatinine, Ser: 1.05 mg/dL (ref 0.50–1.35)
GFR calc Af Amer: >90 mL/min (ref >90)
GFR calc non Af Amer: >90 mL/min (ref >90)
GLUCOSE: 93 mg/dL (ref 70–99)
POTASSIUM: 3.4 mmol/L — AB (ref 3.5–5.1)
SODIUM: 138 mmol/L (ref 135–145)

## 2014-05-09 LAB — URINE CULTURE
COLONY COUNT: NO GROWTH
Culture: NO GROWTH

## 2014-05-09 MED ORDER — POTASSIUM CHLORIDE CRYS ER 20 MEQ PO TBCR
40.0000 meq | EXTENDED_RELEASE_TABLET | Freq: Once | ORAL | Status: AC
  Administered 2014-05-09: 40 meq via ORAL
  Filled 2014-05-09: qty 2

## 2014-05-09 NOTE — Progress Notes (Signed)
Report called to Tiffany RN on 5W.

## 2014-05-09 NOTE — Progress Notes (Signed)
Transferred to [id number].

## 2014-05-09 NOTE — Progress Notes (Signed)
Pt. Arrived to the unit via wheelchair. Pt. Is alert and oriented with no signs of distress noted. Pt. Vitals appear stable with no skin issues noted. Pt. Ambulated from wheelchair to the bed and tolerated well. Educated pt. On use of staff numbers, room telephone and call bell. Call light within reach. Orders released. No further needs noted at this time. 

## 2014-05-09 NOTE — Progress Notes (Signed)
[facility] PROGRESS NOTE  Kenneth Hernandez ZOX:[id number] DOB: [birth year] DOA: 05/08/2014 PCP: No primary care provider on file.  Assessment/Plan: 1. Sepsis/SIRS -Present on admission, evidenced by a heart rate in the 130s, respiratory rate of 28, temperature of 101.4, lactate of 2.44. -Unclear source of infection at this point, imaging studies of lungs and abdomen not revealing acute pathology. -Nursing staff did report multiple episodes of nonbloody diarrhea. -Blood cultures obtained on admission and are pending at the time of this dictation -Urinalysis unremarkable as there was no CT evidence of hydronephrosis or stones -He does not appear to be lethargic or having mental status changes to suggest central nervous system pathology. Nuchal rigidity was not appreciated on physical exam -Pending GI pathogen panel. -Patient is much improved today. -Will stop broad spectrum antimicrobial therapy and observe him off of antibiotics. I think its probable that this is viral. Particularly with history of sick contact and presence of GI symptoms.     Code Status: FULL CODE Family Communication: Spoke with his wife who was present at bedside Disposition Plan: Will transfer to med/surg   Antibiotics:  Vancomycin  Cefepime  HPI/Subjective: Patient is a pleasant 33 year old gentleman witha thickened past medical history who was admitted to the medicine service on 05/08/2014. He presented with complaints of fevers, chills, malaise, generalized weakness along with bilateral flank pain. He was found to be ill-appearing, tachycardic having a pulse of 138 with an increased respiratory to 28. Labs were unremarkable however with white count of 10.4, chemistries within normal limits. Chest x-ray did not reveal acute cardiopulmonary process. CT scan of lungs were negative for pulmonary embolism. The lungs remain clear bilaterally no evidence of pneumonia. He had a noncontrast abdominal CT which did not show  evidence of acute intra-abdominal pathology. His kidneys were unremarkable in appearance as there is no evidence of hydronephrosis nor was her evidence of renal or ureteral stones. Blood cultures were obtained, he was started on empiric IV antimicrobial therapy with cefepime and vancomycin.  Objective: Filed Vitals:   05/09/14 0400  BP: 125/74  Pulse:   Temp: 97.6 F (36.4 C)  Resp:     Intake/Output Summary (Last 24 hours) at 05/09/14 0729 Last data filed at 05/09/14 0700  Gross per 24 hour  Intake   2910 ml  Output   1100 ml  Net   1810 ml   Filed Weights   05/08/14 0038 05/08/14 1316  Weight: 106.595 kg (235 lb) 105.8 kg (233 lb 4 oz)    Exam:   General:  NAD, much improved today, nontoxic appearing  Cardiovascular: regular rate rhythm normal S1-S2  Respiratory: Normal respiratory effort, lungs are clear to auscultation bilaterally  Abdomen: Soft nontender nondistended  Musculoskeletal: No edema  Data Reviewed: Basic Metabolic Panel:  Recent Labs Lab 05/08/14 0043 05/09/14 0348  NA 139 138  K 4.4 3.4*  CL 105 109  CO2 26 22  GLUCOSE 113* 93  BUN 16 11  CREATININE 1.19 1.05  CALCIUM 8.5 7.2*   Liver Function Tests:  Recent Labs Lab 05/08/14 0043  AST 23  ALT 28  ALKPHOS 61  BILITOT 0.7  PROT 6.7  ALBUMIN 3.8    Recent Labs Lab 05/08/14 0043  LIPASE 33   No results for input(s): AMMONIA in the last 168 hours. CBC:  Recent Labs Lab 05/08/14 0043 05/09/14 0348  WBC 10.4 5.9  NEUTROABS 9.3*  --   HGB 16.3 13.4  HCT 48.6 40.5  MCV 85.9 88.8  PLT 166  124*   Cardiac Enzymes: No results for input(s): CKTOTAL, CKMB, CKMBINDEX, TROPONINI in the last 168 hours. BNP (last 3 results) No results for input(s): PROBNP in the last 8760 hours. CBG: No results for input(s): GLUCAP in the last 168 hours.  Recent Results (from the past 240 hour(s))  MRSA PCR Screening     Status: None   Collection Time: 05/08/14  1:24 PM  Result Value Ref  Range Status   MRSA by PCR NEGATIVE NEGATIVE Final    Comment:        The GeneXpert MRSA Assay (FDA approved for NASAL specimens only), is one component of a comprehensive MRSA colonization surveillance program. It is not intended to diagnose MRSA infection nor to guide or monitor treatment for MRSA infections.      Studies: Ct Angio Chest Pe W/cm &/or Wo Cm  05/08/2014   CLINICAL DATA:  Acute onset of nausea and vomiting. Supraventricular tachycardia. Bilateral lower back pain and fever. Initial encounter.  EXAM: CT ANGIOGRAPHY CHEST  CT ABDOMEN AND PELVIS WITHOUT CONTRAST  TECHNIQUE: Multidetector CT imaging of the chest was performed using the standard protocol during bolus administration of intravenous contrast. Multiplanar CT image reconstructions and MIPs were obtained to evaluate the vascular anatomy. Multidetector CT imaging of the abdomen and pelvis was performed using the standard protocol, prior to the administration of intravenous contrast.  CONTRAST:  100mL OMNIPAQUE IOHEXOL 350 MG/ML SOLN  COMPARISON:  CT of the abdomen and pelvis from 01/14/2013, and chest radiograph performed earlier today at 12:39 a.m.  FINDINGS: CTA CHEST FINDINGS  There is no evidence of pulmonary embolus.  The lungs are essentially clear bilaterally. There is no evidence of significant focal consolidation, pleural effusion or pneumothorax. No masses are identified; no abnormal focal contrast enhancement is seen.  The mediastinum is unremarkable in appearance. No mediastinal lymphadenopathy is seen. No pericardial effusion is identified. The great vessels are grossly unremarkable in appearance. No axillary lymphadenopathy is seen. The visualized portions of the thyroid gland are unremarkable in appearance.  No acute osseous abnormalities are seen.  CT ABDOMEN and PELVIS FINDINGS  The liver and spleen are unremarkable in appearance. The gallbladder is within normal limits. The pancreas and adrenal glands are  unremarkable.  The kidneys are unremarkable in appearance. There is no evidence of hydronephrosis. No renal or ureteral stones are seen. Minimal nonspecific perinephric stranding is noted bilaterally.  No free fluid is identified. The small bowel is unremarkable in appearance. The stomach is within normal limits. No acute vascular abnormalities are seen.  The appendix is normal in caliber and contains air, without evidence for appendicitis. The colon is unremarkable in appearance.  The bladder is mildly distended and grossly unremarkable. A small urachal remnant is incidentally seen. The prostate is normal in size. No inguinal lymphadenopathy is seen.  No acute osseous abnormalities are identified.  Review of the MIP images confirms the above findings.  IMPRESSION: 1. No evidence of pulmonary embolus. Lungs remain clear bilaterally. 2. Unremarkable noncontrast CT of the abdomen and pelvis.   Electronically Signed   By: Roanna RaiderJeffery  Chang M.D.   On: 05/08/2014 02:11   Dg Chest Port 1 View  05/08/2014   CLINICAL DATA:  Acute onset of generalized chest pain and shortness of breath. Tachycardia. Initial encounter.  EXAM: PORTABLE CHEST - 1 VIEW  COMPARISON:  Chest radiograph performed 01/14/2013  FINDINGS: The lungs are well-aerated and clear. There is no evidence of focal opacification, pleural effusion or pneumothorax.  The cardiomediastinal  silhouette is within normal limits. No acute osseous abnormalities are seen.  IMPRESSION: No acute cardiopulmonary process seen.   Electronically Signed   By: Roanna RaiderJeffery  Chang M.D.   On: 05/08/2014 01:13   Ct Renal Stone Study  05/08/2014   CLINICAL DATA:  Acute onset of nausea and vomiting. Supraventricular tachycardia. Bilateral lower back pain and fever. Initial encounter.  EXAM: CT ANGIOGRAPHY CHEST  CT ABDOMEN AND PELVIS WITHOUT CONTRAST  TECHNIQUE: Multidetector CT imaging of the chest was performed using the standard protocol during bolus administration of intravenous  contrast. Multiplanar CT image reconstructions and MIPs were obtained to evaluate the vascular anatomy. Multidetector CT imaging of the abdomen and pelvis was performed using the standard protocol, prior to the administration of intravenous contrast.  CONTRAST:  100mL OMNIPAQUE IOHEXOL 350 MG/ML SOLN  COMPARISON:  CT of the abdomen and pelvis from 01/14/2013, and chest radiograph performed earlier today at 12:39 a.m.  FINDINGS: CTA CHEST FINDINGS  There is no evidence of pulmonary embolus.  The lungs are essentially clear bilaterally. There is no evidence of significant focal consolidation, pleural effusion or pneumothorax. No masses are identified; no abnormal focal contrast enhancement is seen.  The mediastinum is unremarkable in appearance. No mediastinal lymphadenopathy is seen. No pericardial effusion is identified. The great vessels are grossly unremarkable in appearance. No axillary lymphadenopathy is seen. The visualized portions of the thyroid gland are unremarkable in appearance.  No acute osseous abnormalities are seen.  CT ABDOMEN and PELVIS FINDINGS  The liver and spleen are unremarkable in appearance. The gallbladder is within normal limits. The pancreas and adrenal glands are unremarkable.  The kidneys are unremarkable in appearance. There is no evidence of hydronephrosis. No renal or ureteral stones are seen. Minimal nonspecific perinephric stranding is noted bilaterally.  No free fluid is identified. The small bowel is unremarkable in appearance. The stomach is within normal limits. No acute vascular abnormalities are seen.  The appendix is normal in caliber and contains air, without evidence for appendicitis. The colon is unremarkable in appearance.  The bladder is mildly distended and grossly unremarkable. A small urachal remnant is incidentally seen. The prostate is normal in size. No inguinal lymphadenopathy is seen.  No acute osseous abnormalities are identified.  Review of the MIP images  confirms the above findings.  IMPRESSION: 1. No evidence of pulmonary embolus. Lungs remain clear bilaterally. 2. Unremarkable noncontrast CT of the abdomen and pelvis.   Electronically Signed   By: Roanna RaiderJeffery  Chang M.D.   On: 05/08/2014 02:11    Scheduled Meds: . enoxaparin (LOVENOX) injection  50 mg Subcutaneous Q24H  . Influenza vac split quadrivalent PF  0.5 mL Intramuscular Tomorrow-1000  . potassium chloride  40 mEq Oral Once   Continuous Infusions: . sodium chloride 1,000 mL (05/09/14 0110)    Principal Problem:   Sepsis Active Problems:   Nausea and vomiting   Sinus tachycardia   Febrile illness    Time spent: 25 min    Jeralyn BennettZAMORA, Jarman Litton  [facility] Pager [phone]. If 7PM-7AM, please contact night-coverage at [email or web address], password [email or web address] 05/09/2014, 7:29 AM  LOS: 1 day

## 2014-05-09 NOTE — Progress Notes (Signed)
Attempted to call report

## 2014-05-09 NOTE — Progress Notes (Signed)
Attempted report 

## 2014-05-10 DIAGNOSIS — B349 Viral infection, unspecified: Secondary | ICD-10-CM

## 2014-05-10 LAB — BASIC METABOLIC PANEL
Anion gap: 5 (ref 5–15)
BUN: 6 mg/dL (ref 6–23)
CALCIUM: 7.6 mg/dL — AB (ref 8.4–10.5)
CO2: 27 mmol/L (ref 19–32)
Chloride: 107 mEq/L (ref 96–112)
Creatinine, Ser: 0.83 mg/dL (ref 0.50–1.35)
GFR calc Af Amer: >90 mL/min (ref >90)
GFR calc non Af Amer: >90 mL/min (ref >90)
GLUCOSE: 86 mg/dL (ref 70–99)
Potassium: 3.7 mmol/L (ref 3.5–5.1)
SODIUM: 139 mmol/L (ref 135–145)

## 2014-05-10 LAB — CBC
HEMATOCRIT: 37 % — AB (ref 39.0–52.0)
HEMOGLOBIN: 11.9 g/dL — AB (ref 13.0–17.0)
MCH: 28.4 pg (ref 26.0–34.0)
MCHC: 32.2 g/dL (ref 30.0–36.0)
MCV: 88.3 fL (ref 78.0–100.0)
Platelets: 120 10*3/uL — ABNORMAL LOW (ref 150–400)
RBC: 4.19 MIL/uL — AB (ref 4.22–5.81)
RDW: 14 % (ref 11.5–15.5)
WBC: 4.5 10*3/uL (ref 4.0–10.5)

## 2014-05-10 MED ORDER — ONDANSETRON HCL 4 MG PO TABS: 4.0000 mg | ORAL_TABLET | Freq: Three times a day (TID) | ORAL | Status: DC | PRN

## 2014-05-10 NOTE — Discharge Summary (Signed)
Physician Discharge Summary  Kenneth Hernandez WUJ:[id number] DOB: 11/27/1980 DOA: 05/08/2014  PCP: No primary care provider on file.  Admit date: 05/08/2014 Discharge date: 05/10/2014  Time spent: 35 minutes  Recommendations for Outpatient Follow-up:  1.  Suspect patient's illness related to viral syndrome, recommended short term follow up with PCP  Discharge Diagnoses:  Principal Problem:   Sepsis Active Problems:   Nausea and vomiting   Sinus tachycardia   Febrile illness   Discharge Condition: Stable/Improved  Diet recommendation: Regular Diet  Filed Weights   05/08/14 0038 05/08/14 1316  Weight: 106.595 kg (235 lb) 105.8 kg (233 lb 4 oz)    History of present illness:  Kenneth BenCory Freestone is a 33 y.o. male who presents to the ED with complaints of Fever and Chills and Bilateral Flank PAin with nausea and Vomiting that started at 9 pm. He denies any diarrhea. His daughter subsequently became ill a few hours later . His heart rate was found to be in the 140's , on Route to the ED he was administered adenosine x2, without improvement. He was administered IVF and Anti-Emetics in the ED and CT scan of ABD and Renal were performed and were negative for Renal stones or etiology.  Hospital Course:  Patient is a pleasant 33 year old gentleman with no significant past medical history who was admitted to the medicine service on 05/08/2014. He presented with complaints of fevers, chills, malaise, generalized weakness along with bilateral flank pain. He was found to be ill-appearing, tachycardic having a pulse of 138 with an increased respiratory to 28. Labs were unremarkable however with white count of 10.4, chemistries within normal limits. Chest x-ray did not reveal acute cardiopulmonary process. CT scan of lungs were negative for pulmonary embolism. The lungs remain clear bilaterally no evidence of pneumonia. He had a noncontrast abdominal CT which did not show evidence of acute intra-abdominal  pathology. His kidneys were unremarkable in appearance as there is no evidence of hydronephrosis nor was her evidence of renal or ureteral stones. Blood cultures were obtained, he was started on empiric IV antimicrobial therapy with cefepime and vancomycin. I suspect this was due to viral syndrome as he reported sick contact from his daughter who seemed to have a febrile illness. IV antibiotics were stopped. He remained stable remained afebrile, was tolerating PO intake and was discharged to his home on 05/10/2014.    Discharge Exam: Filed Vitals:   05/10/14 0540  BP: 116/66  Pulse: 86  Temp: 98 F (36.7 C)  Resp: 16    General: NAD, much improved today, nontoxic appearing  Cardiovascular: regular rate rhythm normal S1-S2  Respiratory: Normal respiratory effort, lungs are clear to auscultation bilaterally  Abdomen: Soft nontender nondistended  Musculoskeletal: No edema  Discharge Instructions   Discharge Instructions    Call MD for:  difficulty breathing, headache or visual disturbances    Complete by:  As directed      Call MD for:  extreme fatigue    Complete by:  As directed      Call MD for:  hives    Complete by:  As directed      Call MD for:  persistant dizziness or light-headedness    Complete by:  As directed      Call MD for:  persistant nausea and vomiting    Complete by:  As directed      Call MD for:  redness, tenderness, or signs of infection (pain, swelling, redness, odor or green/yellow discharge around incision site)  Complete by:  As directed      Call MD for:  severe uncontrolled pain    Complete by:  As directed      Call MD for:  temperature >100.4    Complete by:  As directed      Diet - low sodium heart healthy    Complete by:  As directed      Discharge instructions    Complete by:  As directed   Please follow up with your family doctor in 1 week     Increase activity slowly    Complete by:  As directed           Current Discharge  Medication List    START taking these medications   Details  ondansetron (ZOFRAN) 4 MG tablet Take 1 tablet (4 mg total) by mouth every 8 (eight) hours as needed for nausea or vomiting. Qty: 10 tablet, Refills: 0      CONTINUE these medications which have NOT CHANGED   Details  beclomethasone (QVAR) 40 MCG/ACT inhaler Inhale 1 puff into the lungs as needed (for wheezing).    dimenhyDRINATE (DRAMAMINE) 50 MG tablet Take 50 mg by mouth every 8 (eight) hours as needed for nausea.       STOP taking these medications     anti-nausea (EMETROL) solution      diphenhydrAMINE (BENADRYL) 25 MG tablet        Allergies  Allergen Reactions  . Morphine Nausea And Vomiting  . Penicillins Other (See Comments)    unknown      The results of significant diagnostics from this hospitalization (including imaging, microbiology, ancillary and laboratory) are listed below for reference.    Significant Diagnostic Studies: Ct Angio Chest Pe W/cm &/or Wo Cm  05/08/2014   CLINICAL DATA:  Acute onset of nausea and vomiting. Supraventricular tachycardia. Bilateral lower back pain and fever. Initial encounter.  EXAM: CT ANGIOGRAPHY CHEST  CT ABDOMEN AND PELVIS WITHOUT CONTRAST  TECHNIQUE: Multidetector CT imaging of the chest was performed using the standard protocol during bolus administration of intravenous contrast. Multiplanar CT image reconstructions and MIPs were obtained to evaluate the vascular anatomy. Multidetector CT imaging of the abdomen and pelvis was performed using the standard protocol, prior to the administration of intravenous contrast.  CONTRAST:  OMNIPAQUE IOHEXOL 350 MG/ML SOLN  COMPARISON:  CT of the abdomen and pelvis from 01/14/2013, and chest radiograph performed earlier today at 12:39 a.m.  FINDINGS: CTA CHEST FINDINGS  There is no evidence of pulmonary embolus.  The lungs are essentially clear bilaterally. There is no evidence of significant focal consolidation, pleural  effusion or pneumothorax. No masses are identified; no abnormal focal contrast enhancement is seen.  The mediastinum is unremarkable in appearance. No mediastinal lymphadenopathy is seen. No pericardial effusion is identified. The great vessels are grossly unremarkable in appearance. No axillary lymphadenopathy is seen. The visualized portions of the thyroid gland are unremarkable in appearance.  No acute osseous abnormalities are seen.  CT ABDOMEN and PELVIS FINDINGS  The liver and spleen are unremarkable in appearance. The gallbladder is within normal limits. The pancreas and adrenal glands are unremarkable.  The kidneys are unremarkable in appearance. There is no evidence of hydronephrosis. No renal or ureteral stones are seen. Minimal nonspecific perinephric stranding is noted bilaterally.  No free fluid is identified. The small bowel is unremarkable in appearance. The stomach is within normal limits. No acute vascular abnormalities are seen.  The appendix is normal in caliber  and contains air, without evidence for appendicitis. The colon is unremarkable in appearance.  The bladder is mildly distended and grossly unremarkable. A small urachal remnant is incidentally seen. The prostate is normal in size. No inguinal lymphadenopathy is seen.  No acute osseous abnormalities are identified.  Review of the MIP images confirms the above findings.  IMPRESSION: 1. No evidence of pulmonary embolus. Lungs remain clear bilaterally. 2. Unremarkable noncontrast CT of the abdomen and pelvis.   Electronically Signed   By: Roanna RaiderJeffery  Chang M.D.   On: 05/08/2014 02:11   Dg Chest Port 1 View  05/08/2014   CLINICAL DATA:  Acute onset of generalized chest pain and shortness of breath. Tachycardia. Initial encounter.  EXAM: PORTABLE CHEST - 1 VIEW  COMPARISON:  Chest radiograph performed 01/14/2013  FINDINGS: The lungs are well-aerated and clear. There is no evidence of focal opacification, pleural effusion or pneumothorax.  The  cardiomediastinal silhouette is within normal limits. No acute osseous abnormalities are seen.  IMPRESSION: No acute cardiopulmonary process seen.   Electronically Signed   By: Roanna RaiderJeffery  Chang M.D.   On: 05/08/2014 01:13   Ct Renal Stone Study  05/08/2014   CLINICAL DATA:  Acute onset of nausea and vomiting. Supraventricular tachycardia. Bilateral lower back pain and fever. Initial encounter.  EXAM: CT ANGIOGRAPHY CHEST  CT ABDOMEN AND PELVIS WITHOUT CONTRAST  TECHNIQUE: Multidetector CT imaging of the chest was performed using the standard protocol during bolus administration of intravenous contrast. Multiplanar CT image reconstructions and MIPs were obtained to evaluate the vascular anatomy. Multidetector CT imaging of the abdomen and pelvis was performed using the standard protocol, prior to the administration of intravenous contrast.  CONTRAST:  100mL OMNIPAQUE IOHEXOL 350 MG/ML SOLN  COMPARISON:  CT of the abdomen and pelvis from 01/14/2013, and chest radiograph performed earlier today at 12:39 a.m.  FINDINGS: CTA CHEST FINDINGS  There is no evidence of pulmonary embolus.  The lungs are essentially clear bilaterally. There is no evidence of significant focal consolidation, pleural effusion or pneumothorax. No masses are identified; no abnormal focal contrast enhancement is seen.  The mediastinum is unremarkable in appearance. No mediastinal lymphadenopathy is seen. No pericardial effusion is identified. The great vessels are grossly unremarkable in appearance. No axillary lymphadenopathy is seen. The visualized portions of the thyroid gland are unremarkable in appearance.  No acute osseous abnormalities are seen.  CT ABDOMEN and PELVIS FINDINGS  The liver and spleen are unremarkable in appearance. The gallbladder is within normal limits. The pancreas and adrenal glands are unremarkable.  The kidneys are unremarkable in appearance. There is no evidence of hydronephrosis. No renal or ureteral stones are seen.  Minimal nonspecific perinephric stranding is noted bilaterally.  No free fluid is identified. The small bowel is unremarkable in appearance. The stomach is within normal limits. No acute vascular abnormalities are seen.  The appendix is normal in caliber and contains air, without evidence for appendicitis. The colon is unremarkable in appearance.  The bladder is mildly distended and grossly unremarkable. A small urachal remnant is incidentally seen. The prostate is normal in size. No inguinal lymphadenopathy is seen.  No acute osseous abnormalities are identified.  Review of the MIP images confirms the above findings.  IMPRESSION: 1. No evidence of pulmonary embolus. Lungs remain clear bilaterally. 2. Unremarkable noncontrast CT of the abdomen and pelvis.   Electronically Signed   By: Roanna RaiderJeffery  Chang M.D.   On: 05/08/2014 02:11    Microbiology: Recent Results (from the past 240 hour(s))  Urine culture     Status: None   Collection Time: 05/08/14  1:34 AM  Result Value Ref Range Status   Specimen Description URINE, CLEAN CATCH  Final   Special Requests NONE  Final   Colony Count NO GROWTH Performed at [facility]   Final   Culture NO GROWTH Performed at [facility]   Final   Report Status 05/09/2014 FINAL  Final  Culture, blood (routine x 2)     Status: None (Preliminary result)   Collection Time: 05/08/14  9:15 AM  Result Value Ref Range Status   Specimen Description BLOOD RIGHT HAND  Final   Special Requests BOTTLES DRAWN AEROBIC AND ANAEROBIC 5CC  Final   Culture   Final           BLOOD CULTURE RECEIVED NO GROWTH TO DATE CULTURE WILL BE HELD FOR 5 DAYS BEFORE ISSUING A FINAL NEGATIVE REPORT Performed at [facility]    Report Status PENDING  Incomplete  Culture, blood (routine x 2)     Status: None (Preliminary result)   Collection Time: 05/08/14  9:20 AM  Result Value Ref Range Status   Specimen Description BLOOD LEFT HAND  Final   Special Requests BOTTLES  DRAWN AEROBIC AND ANAEROBIC 5CC  Final   Culture   Final           BLOOD CULTURE RECEIVED NO GROWTH TO DATE CULTURE WILL BE HELD FOR 5 DAYS BEFORE ISSUING A FINAL NEGATIVE REPORT Performed at [facility]    Report Status PENDING  Incomplete  MRSA PCR Screening     Status: None   Collection Time: 05/08/14  1:24 PM  Result Value Ref Range Status   MRSA by PCR NEGATIVE NEGATIVE Final    Comment:        The GeneXpert MRSA Assay (FDA approved for NASAL specimens only), is one component of a comprehensive MRSA colonization surveillance program. It is not intended to diagnose MRSA infection nor to guide or monitor treatment for MRSA infections.      Labs: Basic Metabolic Panel:  Recent Labs Lab 05/08/14 0043 05/09/14 0348 05/10/14 0440  NA 139 138 139  K 4.4 3.4* 3.7  CL 105 109 107  CO2 26 22 27   GLUCOSE 113* 93 86  BUN 16 11 6   CREATININE 1.19 1.05 0.83  CALCIUM 8.5 7.2* 7.6*   Liver Function Tests:  Recent Labs Lab 05/08/14 0043  AST 23  ALT 28  ALKPHOS 61  BILITOT 0.7  PROT 6.7  ALBUMIN 3.8    Recent Labs Lab 05/08/14 0043  LIPASE 33   No results for input(s): AMMONIA in the last 168 hours. CBC:  Recent Labs Lab 05/08/14 0043 05/09/14 0348 05/10/14 0440  WBC 10.4 5.9 4.5  NEUTROABS 9.3*  --   --   HGB 16.3 13.4 11.9*  HCT 48.6 40.5 37.0*  MCV 85.9 88.8 88.3  PLT 166 124* 120*   Cardiac Enzymes: No results for input(s): CKTOTAL, CKMB, CKMBINDEX, TROPONINI in the last 168 hours. BNP: BNP (last 3 results) No results for input(s): PROBNP in the last 8760 hours. CBG: No results for input(s): GLUCAP in the last 168 hours.     SignedJeralyn Bennett  [facility] 05/10/2014, 11:08 AM

## 2014-05-10 NOTE — Progress Notes (Signed)
Pt given discharge instructions and prescriptions.  PIVs removed.  Pt taken to discharge location via wheelchair.

## 2014-05-11 NOTE — Progress Notes (Signed)
Utilization Review Completed.  

## 2014-05-14 LAB — GI PATHOGEN PANEL BY PCR, STOOL
C DIFFICILE TOXIN A/B: NEGATIVE
CAMPYLOBACTER BY PCR: NEGATIVE
Cryptosporidium by PCR: NEGATIVE
E coli (ETEC) LT/ST: NEGATIVE
E coli (STEC): NEGATIVE
E coli 0157 by PCR: NEGATIVE
G lamblia by PCR: NEGATIVE
NOROVIRUS G1/G2: NEGATIVE
ROTAVIRUS A BY PCR: NEGATIVE
SALMONELLA BY PCR: NEGATIVE
Shigella by PCR: NEGATIVE

## 2014-05-14 LAB — CULTURE, BLOOD (ROUTINE X 2)
CULTURE: NO GROWTH
Culture: NO GROWTH

## 2014-05-15 DIAGNOSIS — R109 Unspecified abdominal pain: Secondary | ICD-10-CM | POA: Insufficient documentation

## 2015-09-12 IMAGING — CT CT RENAL STONE PROTOCOL
3 of 14 series · 13 of 46 positions shown, 18 images · IV contrast (omnipaque)
Comparison: CT of the abdomen and pelvis from 01/14/2013, and chest
radiograph performed earlier today at [DATE] a.m.

CLINICAL DATA: Acute onset of nausea and vomiting. Supraventricular
tachycardia. Bilateral lower back pain and fever. Initial encounter.

EXAM:
CT ANGIOGRAPHY CHEST
CT ABDOMEN AND PELVIS WITHOUT CONTRAST
TECHNIQUE: Multidetector CT imaging of the chest was performed using the
standard protocol during bolus administration of intravenous
contrast. Multiplanar CT image reconstructions and MIPs were
obtained to evaluate the vascular anatomy. Multidetector CT imaging
of the abdomen and pelvis was performed using the standard protocol,
prior to the administration of intravenous contrast.
CONTRAST:  100mL OMNIPAQUE IOHEXOL 350 MG/ML SOLN

[Series 2: stone study 5.0 i30f 1 · axial · 0.81mm/px · z∈[-592,-312]mm · 3 of 114 slices shown, 7 images]
[im 29/114  soft-tissue]
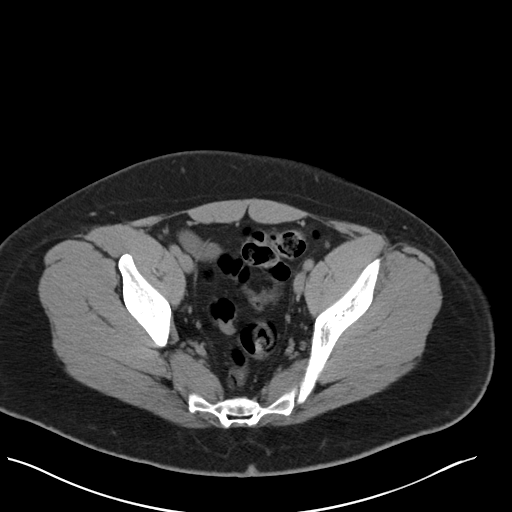
[im 29/114  lung]
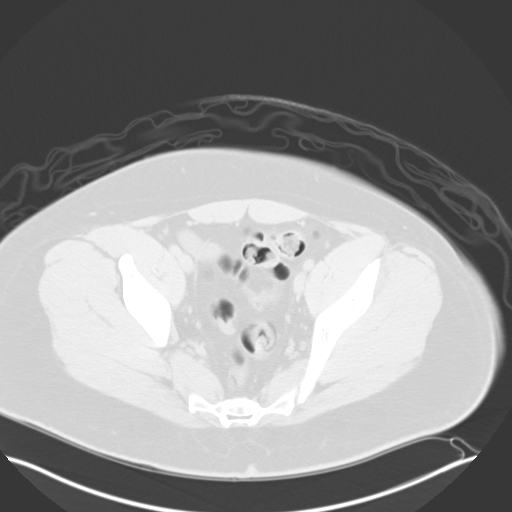
[im 29/114  bone]
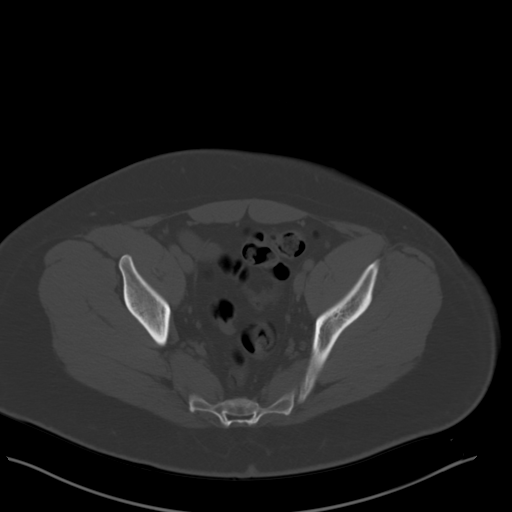
[im 57/114  soft-tissue]
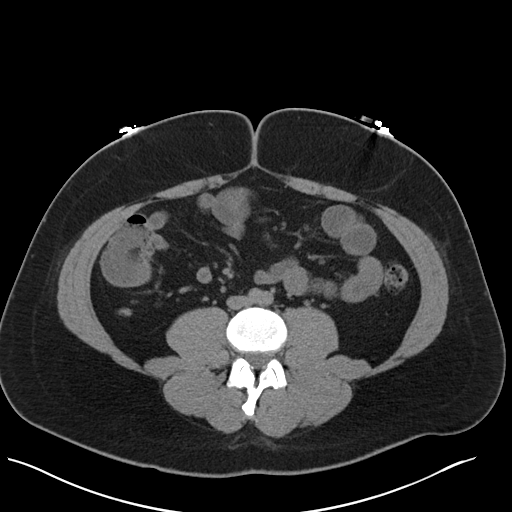
[im 57/114  lung]
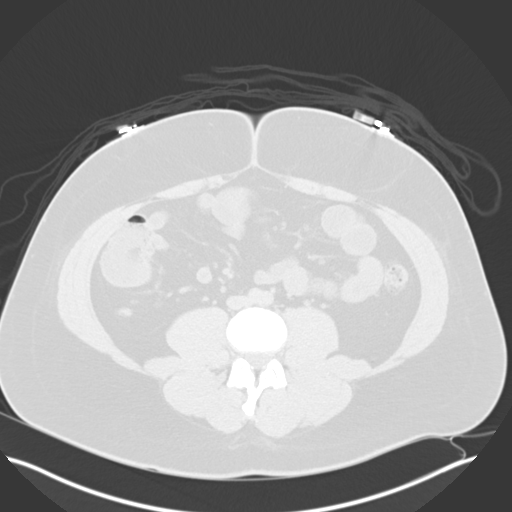
[im 85/114  soft-tissue]
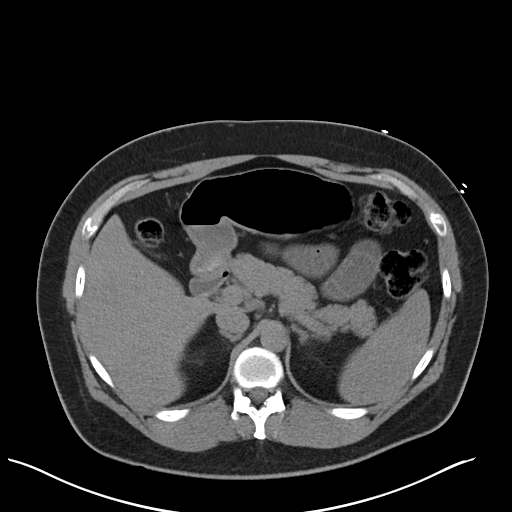
[im 85/114  lung]
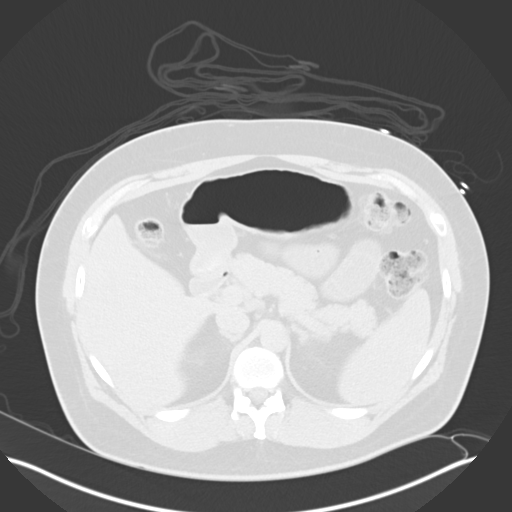

[Series 11: thins · axial · 0.73mm/px · z∈[-283,-55]mm · 8 of 267 slices shown]
[im 20/267  soft-tissue]
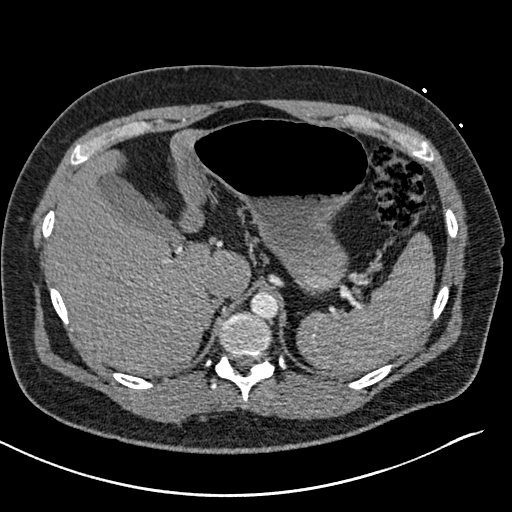
[im 58/267  soft-tissue]
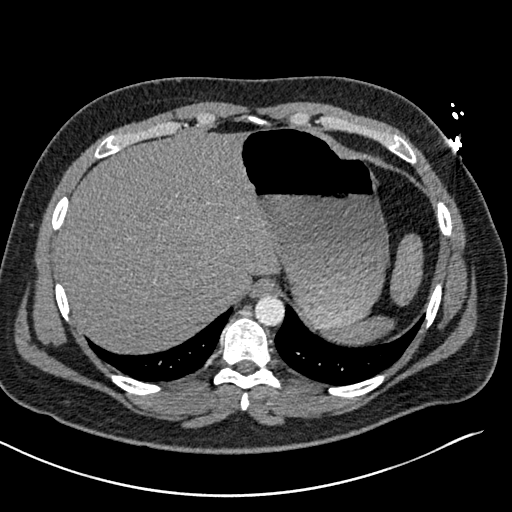
[im 96/267  soft-tissue]
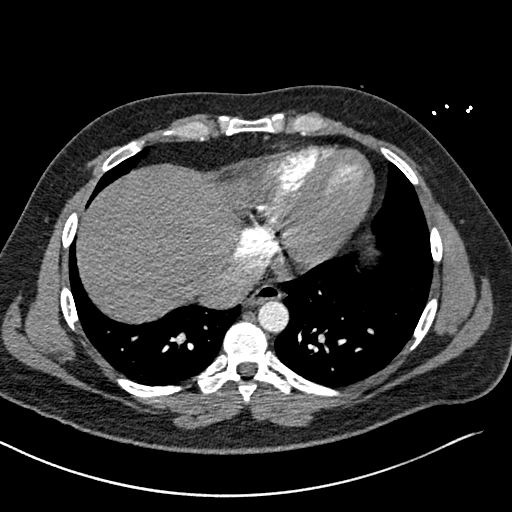
[im 115/267  soft-tissue]
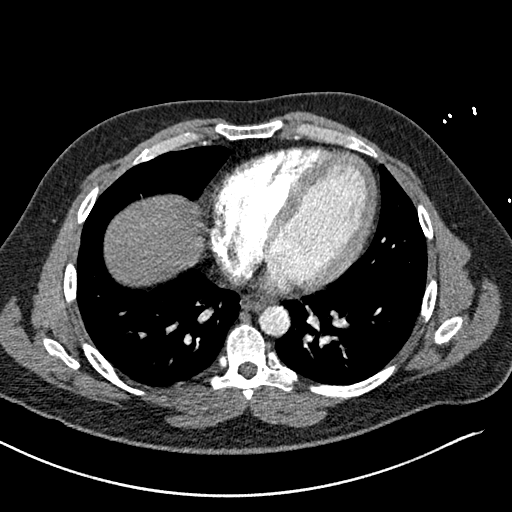
[im 153/267  soft-tissue]
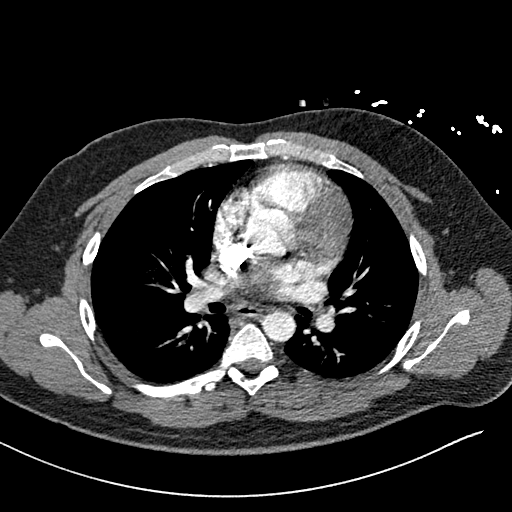
[im 172/267  soft-tissue]
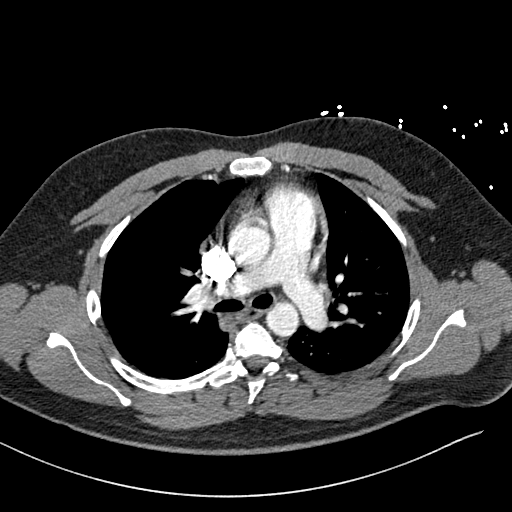
[im 210/267  soft-tissue]
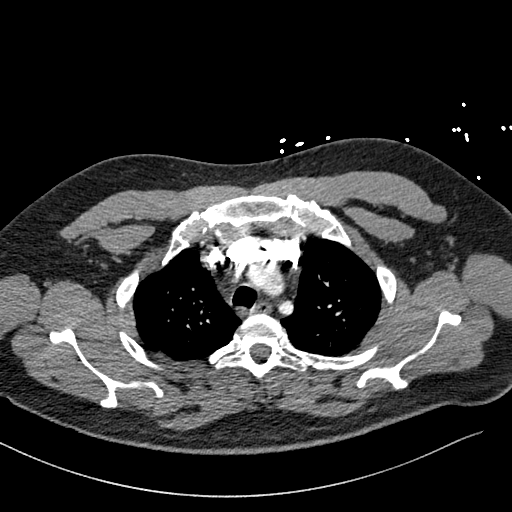
[im 248/267  soft-tissue]
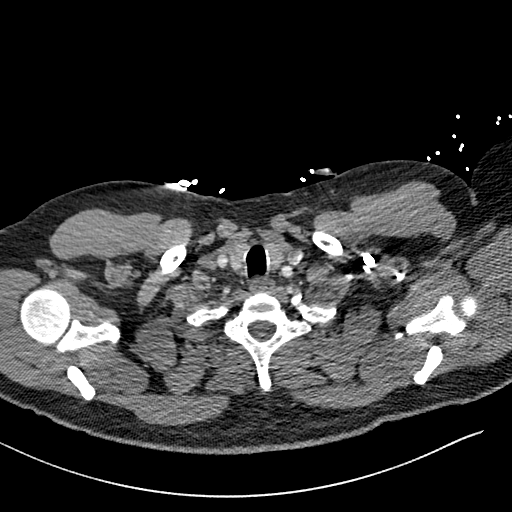

[Series 13: coronal mpr · coronal · 0.55mm/px · 2 of 110 slices shown, 3 images]
[im 37/110  soft-tissue]
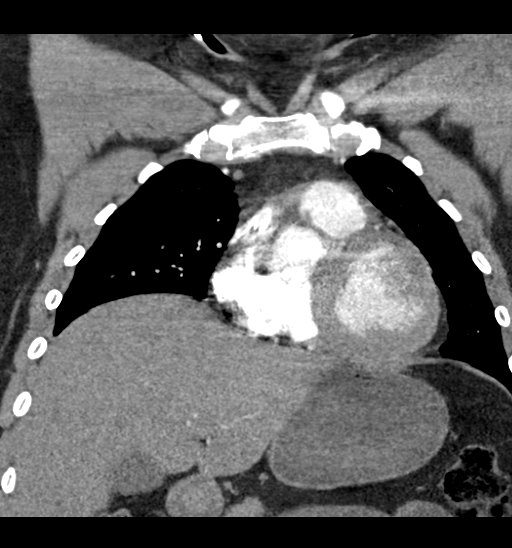
[im 37/110  bone]
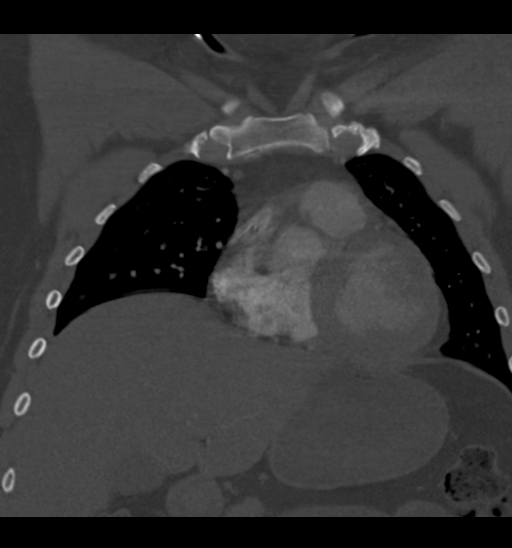
[im 73/110  soft-tissue]
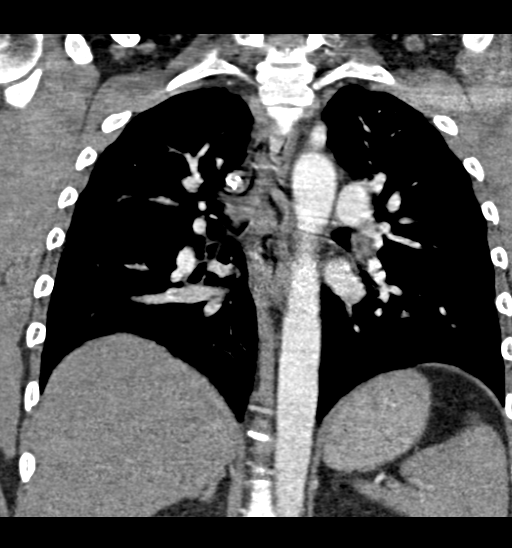

[13 of 46 positions shown; findings below may reference images not displayed]

FINDINGS: CTA CHEST FINDINGS

There is no evidence of pulmonary embolus.

The lungs are essentially clear bilaterally. There is no evidence of
significant focal consolidation, pleural effusion or pneumothorax.
No masses are identified; no abnormal focal contrast enhancement is
seen.

The mediastinum is unremarkable in appearance. No mediastinal
lymphadenopathy is seen. No pericardial effusion is identified. The
great vessels are grossly unremarkable in appearance. No axillary
lymphadenopathy is seen. The visualized portions of the thyroid
gland are unremarkable in appearance.

No acute osseous abnormalities are seen.

CT ABDOMEN and PELVIS FINDINGS

The liver and spleen are unremarkable in appearance. The gallbladder
is within normal limits. The pancreas and adrenal glands are
unremarkable.

The kidneys are unremarkable in appearance. There is no evidence of
hydronephrosis. No renal or ureteral stones are seen. Minimal
nonspecific perinephric stranding is noted bilaterally.

No free fluid is identified. The small bowel is unremarkable in
appearance. The stomach is within normal limits. No acute vascular
abnormalities are seen.

The appendix is normal in caliber and contains air, without evidence
for appendicitis. The colon is unremarkable in appearance.

The bladder is mildly distended and grossly unremarkable. A small
urachal remnant is incidentally seen. The prostate is normal in
size. No inguinal lymphadenopathy is seen.

No acute osseous abnormalities are identified.

Review of the MIP images confirms the above findings.
IMPRESSION: 1. No evidence of pulmonary embolus. Lungs remain clear bilaterally.
2. Unremarkable noncontrast CT of the abdomen and pelvis.

## 2015-09-16 DIAGNOSIS — Z0000 Encounter for general adult medical examination without abnormal findings: Secondary | ICD-10-CM | POA: Diagnosis not present

## 2015-09-16 DIAGNOSIS — Z79899 Other long term (current) drug therapy: Secondary | ICD-10-CM | POA: Diagnosis not present

## 2015-09-16 DIAGNOSIS — E669 Obesity, unspecified: Secondary | ICD-10-CM | POA: Diagnosis not present

## 2015-09-16 DIAGNOSIS — Z789 Other specified health status: Secondary | ICD-10-CM | POA: Diagnosis not present

## 2015-11-23 DIAGNOSIS — H6122 Impacted cerumen, left ear: Secondary | ICD-10-CM | POA: Diagnosis not present

## 2016-05-19 DIAGNOSIS — M25561 Pain in right knee: Secondary | ICD-10-CM | POA: Diagnosis not present

## 2016-05-19 DIAGNOSIS — M25562 Pain in left knee: Secondary | ICD-10-CM | POA: Diagnosis not present

## 2016-05-24 DIAGNOSIS — M25561 Pain in right knee: Secondary | ICD-10-CM | POA: Diagnosis not present

## 2016-05-26 DIAGNOSIS — M25562 Pain in left knee: Secondary | ICD-10-CM | POA: Diagnosis not present

## 2016-05-26 DIAGNOSIS — M25561 Pain in right knee: Secondary | ICD-10-CM | POA: Diagnosis not present

## 2016-07-21 DIAGNOSIS — H6120 Impacted cerumen, unspecified ear: Secondary | ICD-10-CM | POA: Diagnosis not present

## 2016-09-08 DIAGNOSIS — Z1389 Encounter for screening for other disorder: Secondary | ICD-10-CM | POA: Diagnosis not present

## 2016-09-08 DIAGNOSIS — R6882 Decreased libido: Secondary | ICD-10-CM | POA: Diagnosis not present

## 2016-09-08 DIAGNOSIS — E669 Obesity, unspecified: Secondary | ICD-10-CM | POA: Diagnosis not present

## 2016-09-08 DIAGNOSIS — Z0000 Encounter for general adult medical examination without abnormal findings: Secondary | ICD-10-CM | POA: Diagnosis not present

## 2016-09-14 DIAGNOSIS — F419 Anxiety disorder, unspecified: Secondary | ICD-10-CM | POA: Diagnosis not present

## 2016-09-14 DIAGNOSIS — R6882 Decreased libido: Secondary | ICD-10-CM | POA: Diagnosis not present

## 2016-09-14 DIAGNOSIS — F329 Major depressive disorder, single episode, unspecified: Secondary | ICD-10-CM | POA: Diagnosis not present

## 2016-09-14 DIAGNOSIS — E782 Mixed hyperlipidemia: Secondary | ICD-10-CM | POA: Diagnosis not present

## 2016-09-28 DIAGNOSIS — R6882 Decreased libido: Secondary | ICD-10-CM | POA: Diagnosis not present

## 2016-09-30 DIAGNOSIS — J209 Acute bronchitis, unspecified: Secondary | ICD-10-CM | POA: Diagnosis not present

## 2016-10-03 DIAGNOSIS — J209 Acute bronchitis, unspecified: Secondary | ICD-10-CM | POA: Diagnosis not present

## 2016-10-03 DIAGNOSIS — J0100 Acute maxillary sinusitis, unspecified: Secondary | ICD-10-CM | POA: Diagnosis not present

## 2016-10-11 DIAGNOSIS — B9789 Other viral agents as the cause of diseases classified elsewhere: Secondary | ICD-10-CM | POA: Diagnosis not present

## 2016-10-11 DIAGNOSIS — J069 Acute upper respiratory infection, unspecified: Secondary | ICD-10-CM | POA: Diagnosis not present

## 2016-11-25 DIAGNOSIS — F329 Major depressive disorder, single episode, unspecified: Secondary | ICD-10-CM | POA: Diagnosis not present

## 2016-11-25 DIAGNOSIS — E291 Testicular hypofunction: Secondary | ICD-10-CM | POA: Diagnosis not present

## 2017-02-12 ENCOUNTER — Ambulatory Visit (HOSPITAL_COMMUNITY)
Admission: EM | Admit: 2017-02-12 | Discharge: 2017-02-12 | Discharge status: Absent without leave | Payer: PRIVATE HEALTH INSURANCE

## 2017-02-14 DIAGNOSIS — J019 Acute sinusitis, unspecified: Secondary | ICD-10-CM | POA: Diagnosis not present

## 2017-02-14 DIAGNOSIS — R05 Cough: Secondary | ICD-10-CM | POA: Diagnosis not present

## 2018-05-02 DIAGNOSIS — H6122 Impacted cerumen, left ear: Secondary | ICD-10-CM | POA: Diagnosis not present

## 2018-05-22 DIAGNOSIS — H6122 Impacted cerumen, left ear: Secondary | ICD-10-CM | POA: Diagnosis not present

## 2018-05-22 DIAGNOSIS — R030 Elevated blood-pressure reading, without diagnosis of hypertension: Secondary | ICD-10-CM | POA: Diagnosis not present

## 2018-06-28 DIAGNOSIS — I10 Essential (primary) hypertension: Secondary | ICD-10-CM | POA: Diagnosis not present

## 2018-06-28 DIAGNOSIS — F4321 Adjustment disorder with depressed mood: Secondary | ICD-10-CM | POA: Diagnosis not present

## 2018-06-28 DIAGNOSIS — R42 Dizziness and giddiness: Secondary | ICD-10-CM | POA: Diagnosis not present

## 2018-07-17 DIAGNOSIS — J358 Other chronic diseases of tonsils and adenoids: Secondary | ICD-10-CM | POA: Diagnosis not present

## 2018-07-17 DIAGNOSIS — H6123 Impacted cerumen, bilateral: Secondary | ICD-10-CM | POA: Diagnosis not present

## 2018-07-17 DIAGNOSIS — J342 Deviated nasal septum: Secondary | ICD-10-CM | POA: Diagnosis not present

## 2018-07-17 DIAGNOSIS — R42 Dizziness and giddiness: Secondary | ICD-10-CM | POA: Diagnosis not present

## 2018-07-31 DIAGNOSIS — F4321 Adjustment disorder with depressed mood: Secondary | ICD-10-CM | POA: Diagnosis not present

## 2018-07-31 DIAGNOSIS — Z6832 Body mass index (BMI) 32.0-32.9, adult: Secondary | ICD-10-CM | POA: Diagnosis not present

## 2018-07-31 DIAGNOSIS — Z0000 Encounter for general adult medical examination without abnormal findings: Secondary | ICD-10-CM | POA: Diagnosis not present

## 2018-12-06 DIAGNOSIS — Z3009 Encounter for other general counseling and advice on contraception: Secondary | ICD-10-CM | POA: Diagnosis not present

## 2018-12-12 DIAGNOSIS — F411 Generalized anxiety disorder: Secondary | ICD-10-CM | POA: Diagnosis not present

## 2018-12-12 DIAGNOSIS — R072 Precordial pain: Secondary | ICD-10-CM | POA: Diagnosis not present

## 2018-12-12 DIAGNOSIS — I10 Essential (primary) hypertension: Secondary | ICD-10-CM | POA: Diagnosis not present

## 2018-12-12 DIAGNOSIS — E782 Mixed hyperlipidemia: Secondary | ICD-10-CM | POA: Diagnosis not present

## 2019-03-15 DIAGNOSIS — E782 Mixed hyperlipidemia: Secondary | ICD-10-CM | POA: Diagnosis not present

## 2019-03-15 DIAGNOSIS — F411 Generalized anxiety disorder: Secondary | ICD-10-CM | POA: Diagnosis not present

## 2019-03-15 DIAGNOSIS — Z1159 Encounter for screening for other viral diseases: Secondary | ICD-10-CM | POA: Diagnosis not present

## 2019-03-15 DIAGNOSIS — I10 Essential (primary) hypertension: Secondary | ICD-10-CM | POA: Diagnosis not present

## 2019-06-16 DIAGNOSIS — I10 Essential (primary) hypertension: Secondary | ICD-10-CM | POA: Insufficient documentation

## 2019-06-16 DIAGNOSIS — F4321 Adjustment disorder with depressed mood: Secondary | ICD-10-CM | POA: Insufficient documentation

## 2019-06-16 DIAGNOSIS — E782 Mixed hyperlipidemia: Secondary | ICD-10-CM | POA: Insufficient documentation

## 2019-06-16 DIAGNOSIS — F17200 Nicotine dependence, unspecified, uncomplicated: Secondary | ICD-10-CM | POA: Insufficient documentation

## 2019-06-17 ENCOUNTER — Ambulatory Visit: Payer: PRIVATE HEALTH INSURANCE | Admitting: Legal Medicine

## 2019-07-20 ENCOUNTER — Other Ambulatory Visit: Payer: Self-pay | Admitting: Legal Medicine

## 2019-07-20 DIAGNOSIS — E782 Mixed hyperlipidemia: Secondary | ICD-10-CM

## 2019-12-06 ENCOUNTER — Other Ambulatory Visit: Payer: Self-pay | Admitting: Legal Medicine

## 2019-12-06 DIAGNOSIS — I10 Essential (primary) hypertension: Secondary | ICD-10-CM

## 2020-01-06 ENCOUNTER — Other Ambulatory Visit: Payer: Self-pay | Admitting: Legal Medicine

## 2020-01-10 ENCOUNTER — Telehealth: Payer: Self-pay

## 2020-01-15 ENCOUNTER — Other Ambulatory Visit: Payer: Self-pay | Admitting: Legal Medicine

## 2020-01-15 DIAGNOSIS — F4321 Adjustment disorder with depressed mood: Secondary | ICD-10-CM

## 2020-01-15 MED ORDER — VORTIOXETINE HBR 5 MG PO TABS: 5.0000 mg | ORAL_TABLET | Freq: Every day | ORAL | 3 refills | Status: DC

## 2020-01-23 ENCOUNTER — Other Ambulatory Visit: Payer: Self-pay

## 2020-02-25 ENCOUNTER — Ambulatory Visit: Payer: Self-pay | Admitting: Legal Medicine

## 2020-05-16 DIAGNOSIS — M5489 Other dorsalgia: Secondary | ICD-10-CM | POA: Diagnosis not present

## 2020-05-16 DIAGNOSIS — N23 Unspecified renal colic: Secondary | ICD-10-CM | POA: Diagnosis not present

## 2020-06-22 ENCOUNTER — Encounter: Payer: Self-pay | Admitting: Legal Medicine

## 2020-08-22 ENCOUNTER — Other Ambulatory Visit: Payer: Self-pay | Admitting: Legal Medicine

## 2020-11-12 ENCOUNTER — Encounter: Payer: Self-pay | Admitting: Legal Medicine

## 2020-11-12 ENCOUNTER — Ambulatory Visit: Payer: No Typology Code available for payment source | Admitting: Legal Medicine

## 2020-11-12 ENCOUNTER — Other Ambulatory Visit: Payer: Self-pay

## 2020-11-12 VITALS — BP 120/70 | HR 130 | Temp 98.1°F | Resp 16 | Ht 70.0 in | Wt 228.0 lb

## 2020-11-12 DIAGNOSIS — E782 Mixed hyperlipidemia: Secondary | ICD-10-CM

## 2020-11-12 DIAGNOSIS — I471 Supraventricular tachycardia, unspecified: Secondary | ICD-10-CM | POA: Insufficient documentation

## 2020-11-12 DIAGNOSIS — F4321 Adjustment disorder with depressed mood: Secondary | ICD-10-CM

## 2020-11-12 DIAGNOSIS — I10 Essential (primary) hypertension: Secondary | ICD-10-CM | POA: Diagnosis not present

## 2020-11-12 DIAGNOSIS — Z6832 Body mass index (BMI) 32.0-32.9, adult: Secondary | ICD-10-CM | POA: Diagnosis not present

## 2020-11-12 DIAGNOSIS — F17218 Nicotine dependence, cigarettes, with other nicotine-induced disorders: Secondary | ICD-10-CM

## 2020-11-12 MED ORDER — LISINOPRIL 20 MG PO TABS: 20.0000 mg | ORAL_TABLET | Freq: Every day | ORAL | 2 refills | Status: DC

## 2020-11-12 MED ORDER — VILAZODONE HCL 20 MG PO TABS: 20.0000 mg | ORAL_TABLET | Freq: Every day | ORAL | 1 refills | Status: DC

## 2020-11-12 MED ORDER — ROSUVASTATIN CALCIUM 20 MG PO TABS: 20.0000 mg | ORAL_TABLET | Freq: Every day | ORAL | 2 refills | Status: AC

## 2020-11-12 NOTE — Progress Notes (Signed)
Established Patient Office Visit  Subjective:  Patient ID: Kenneth Hernandez, male    DOB: [birth year]  Age: 40 y.o. MRN: [id number]  CC:  Chief Complaint  Patient presents with   Constipation    HPI Kenneth Hernandez presents for abdominal pain, he went to ER AND HAD CT abdomen that was clear.  He has constipation with hard stools and occasional loose BM.  He travels a lot. He gets nauseated but no vomiting.  Patient presents for follow up of hypertension.  Patient tolerating lisinopril well with side effects.  Patient was diagnosed with hypertension 2010 so has been treated for hypertension for 10 years.Patient is working on maintaining diet and exercise regimen and follows up as directed. Complication include none.   Patient presents with hyperlipidemia.  Compliance with treatment has been good; patient takes medicines as directed, maintains low cholesterol diet, follows up as directed, and maintains exercise regimen.  Patient is using atorvastatin without problems.   Past Medical History:  Diagnosis Date   Adjustment disorder with depressed mood    Body mass index (BMI) of 20.0-20.9 in adult    Diverticulitis 2008   Generalized anxiety disorder    Mixed hyperlipidemia     Past Surgical History:  Procedure Laterality Date   reconstruction of right thumb      Family History  Problem Relation Age of Onset   Hyperlipidemia Mother    Hypertension Mother    Hypertension Father    Hyperlipidemia Father    Colon cancer Father    Colon cancer Paternal Grandmother     Social History   Socioeconomic History   Marital status: Married    Spouse name: Not on file   Number of children: 3   Years of education: Not on file   Highest education level: Not on file  Occupational History   Occupation: Sales    Comment: Hydrologist  Tobacco Use   Smoking status: Never   Smokeless tobacco: Current    Types: Designer, multimedia Use   Vaping Use: Unknown  Substance and Sexual Activity    Alcohol use: Yes   Drug use: No   Sexual activity: Not on file  Other Topics Concern   Not on file  Social History Narrative   Not on file   Social Determinants of Health   Financial Resource Strain: Not on file  Food Insecurity: Not on file  Transportation Needs: Not on file  Physical Activity: Not on file  Stress: Not on file  Social Connections: Not on file  Intimate Partner Violence: Not on file    Outpatient Medications Prior to Visit  Medication Sig Dispense Refill   cetirizine (ZYRTEC) 10 MG tablet Take 10 mg by mouth daily.     diphenhydrAMINE (BENADRYL) 50 MG tablet Take 50 mg by mouth at bedtime as needed for itching.     loperamide (IMODIUM) 2 MG capsule Take by mouth as needed for diarrhea or loose stools.     magnesium hydroxide (MILK OF MAGNESIA) 400 MG/5ML suspension Take by mouth daily as needed for mild constipation.     omeprazole (PRILOSEC) 20 MG capsule Take 20 mg by mouth 2 (two) times daily before a meal.     ondansetron (ZOFRAN) 4 MG tablet Take 4 mg by mouth every 8 (eight) hours as needed for nausea or vomiting.     sodium phosphate Pediatric (FLEET) 3.5-9.5 GM/59ML enema Place 1 enema rectally once.     albuterol (VENTOLIN HFA) 108 (90 Base)  MCG/ACT inhaler Inhale into the lungs.     esomeprazole (NEXIUM) 20 MG capsule Take by mouth.     lisinopril (ZESTRIL) 20 MG tablet TAKE 1 TABLET BY MOUTH EVERY DAY AS DIRECTED 90 tablet 2   rosuvastatin (CRESTOR) 20 MG tablet TAKE 1 TABLET BY MOUTH EVERY DAY AS DIRECTED 90 tablet 2   VIIBRYD 20 MG TABS TAKE 1 TABLET ONCE DAILY WITH FOOD 30 tablet 3   beclomethasone (QVAR) 40 MCG/ACT inhaler Inhale 1 puff into the lungs as needed (for wheezing).     dimenhyDRINATE (DRAMAMINE) 50 MG tablet Take 50 mg by mouth every 8 (eight) hours as needed for nausea.      divalproex (DEPAKOTE ER) 500 MG 24 hr tablet Take by mouth.     ondansetron (ZOFRAN) 4 MG tablet Take 1 tablet (4 mg total) by mouth every 8 (eight) hours as needed  for nausea or vomiting. 10 tablet 0   vortioxetine HBr (TRINTELLIX) 5 MG TABS tablet Take 1 tablet (5 mg total) by mouth daily. 30 tablet 3   No facility-administered medications prior to visit.    Allergies  Allergen Reactions   Morphine Nausea And Vomiting   Penicillins Other (See Comments)    unknown    ROS Review of Systems  Constitutional:  Negative for chills, fatigue and fever.  HENT:  Negative for congestion, ear pain and sore throat.   Respiratory:  Negative for cough and shortness of breath.   Cardiovascular:  Negative for chest pain.  Gastrointestinal:  Positive for nausea. Negative for abdominal pain (lower abdomen), constipation, diarrhea and vomiting.  Endocrine: Negative for polydipsia, polyphagia and polyuria.  Genitourinary:  Negative for dysuria and frequency.  Musculoskeletal:  Negative for arthralgias and myalgias.  Neurological:  Negative for dizziness and headaches.  Psychiatric/Behavioral:  Negative for dysphoric mood.        No dysphoria     Objective:    Physical Exam Vitals reviewed.  Constitutional:      Appearance: Normal appearance.  HENT:     Head: Normocephalic and atraumatic.     Right Ear: There is impacted cerumen.     Left Ear: There is impacted cerumen.     Ears:     Comments: To see Dr. Marcheta Grammes    Mouth/Throat:     Mouth: Mucous membranes are moist.     Pharynx: Oropharynx is clear.  Eyes:     Extraocular Movements: Extraocular movements intact.     Conjunctiva/sclera: Conjunctivae normal.     Pupils: Pupils are equal, round, and reactive to light.  Cardiovascular:     Rate and Rhythm: Normal rate and regular rhythm.     Pulses: Normal pulses.     Heart sounds: Normal heart sounds. No murmur heard.   No gallop.  Pulmonary:     Effort: Pulmonary effort is normal. No respiratory distress.     Breath sounds: Normal breath sounds. No wheezing.  Abdominal:     General: Abdomen is flat. Bowel sounds are normal.     Palpations: Abdomen  is soft.  Musculoskeletal:     Cervical back: Normal range of motion and neck supple.  Neurological:     Mental Status: He is alert.    BP 120/70   Pulse (!) 130   Temp 98.1 F (36.7 C)   Resp 16   Ht 5\' 10"  (1.778 m)   Wt 228 lb (103.4 kg)   SpO2 96%   BMI 32.71 kg/m  Wt Readings from Last  3 Encounters:  11/12/20 228 lb (103.4 kg)  05/08/14 233 lb 4 oz (105.8 kg)     Health Maintenance Due  Topic Date Due   COVID-19 Vaccine (1) Never done   HIV Screening  Never done   Hepatitis C Screening  Never done   TETANUS/TDAP  Never done    There are no preventive care reminders to display for this patient.  Lab Results  Component Value Date   TSH 4.807 (H) 05/08/2014   Lab Results  Component Value Date   WBC 4.5 05/10/2014   HGB 11.9 (L) 05/10/2014   HCT 37.0 (L) 05/10/2014   MCV 88.3 05/10/2014   PLT 120 (L) 05/10/2014   Lab Results  Component Value Date   NA 139 05/10/2014   K 3.7 05/10/2014   CO2 27 05/10/2014   GLUCOSE 86 05/10/2014   BUN 6 05/10/2014   CREATININE 0.83 05/10/2014   BILITOT 0.7 05/08/2014   ALKPHOS 61 05/08/2014   AST 23 05/08/2014   ALT 28 05/08/2014   PROT 6.7 05/08/2014   ALBUMIN 3.8 05/08/2014   CALCIUM 7.6 (L) 05/10/2014   ANIONGAP 5 05/10/2014   No results found for: CHOL No results found for: HDL No results found for: LDLCALC No results found for: TRIG No results found for: CHOLHDL No results found for: [id number]    Assessment & Plan:   Problem List Items Addressed This Visit       Cardiovascular and Mediastinum   Essential hypertension, benign   Relevant Medications   rosuvastatin (CRESTOR) 20 MG tablet   lisinopril (ZESTRIL) 20 MG tablet An individual hypertension care plan was established and reinforced today.  The patient's status was assessed using clinical findings on exam and labs or diagnostic tests. The patient's success at meeting treatment goals on disease specific evidence-based guidelines and found to be well  controlled. SELF MANAGEMENT: The patient and I together assessed ways to personally work towards obtaining the recommended goals. RECOMMENDATIONS: avoid decongestants found in common cold remedies, decrease consumption of alcohol, perform routine monitoring of BP with home BP cuff, exercise, reduction of dietary salt, take medicines as prescribed, try not to miss doses and quit smoking.  Regular exercise and maintaining a healthy weight is needed.  Stress reduction may help. A CLINICAL SUMMARY including written plan identify barriers to care unique to individual due to social or financial issues.  We attempt to mutually creat solutions for individual and family understanding.     Supraventricular tachycardia (HCC) - Primary   Relevant Medications   rosuvastatin (CRESTOR) 20 MG tablet   lisinopril (ZESTRIL) 20 MG tablet The SVT was stopped by ablation surgery     Other   Adjustment disorder with depressed mood (Chronic)   Relevant Medications   Vilazodone HCl (VIIBRYD) 20 MG TABS AN INDIVIDUAL CARE PLAN for adjustment disorder was established and reinforced today.  The patient's status was assessed using clinical findings on exam, labs, and other diagnostic testing. Patient's success at meeting treatment goals based on disease specific evidence-bassed guidelines and found to be in good control. RECOMMENDATIONS include maintaining present medicines and treatment.     Mixed hyperlipidemia (Chronic)   Relevant Medications   rosuvastatin (CRESTOR) 20 MG tablet   lisinopril (ZESTRIL) 20 MG tablet   Other Relevant Orders   Lipid panel AN INDIVIDUAL CARE PLAN for hyperlipidemia/ cholesterol was established and reinforced today.  The patient's status was assessed using clinical findings on exam, lab and other diagnostic tests. The patient's disease status  was assessed based on evidence-based guidelines and found to be fair controlled. MEDICATIONS were reviewed. SELF MANAGEMENT GOALS have been  discussed and patient's success at attaining the goal of low cholesterol was assessed. RECOMMENDATION given include regular exercise 3 days a week and low cholesterol/low fat diet. CLINICAL SUMMARY including written plan to identify barriers unique to the patient due to social or economic  reasons was discussed.     Nicotine dependence (Chronic) Individual plan was given to patient based on exam, history and other tests and using evidence based criteria for care for smoking cessation.  We discussed behavioral changes to help cessation and offered counseing to aid in quitting.  Medical consequences of tobacco use were explained.     BMI 32.0-32.9,adult An individualize plan was formulated for obesity using patient history and physical exam to encourage weight loss.  An evidence based program was formulated.  Patient is to cut portion size with meals and to plan physical exercise 3 days a week at least 20 minutes.  Weight watchers and other programs are helpful.  Planned amount of weight loss 10 lbs.    Other Visit Diagnoses     Essential (primary) hypertension       Relevant Medications   rosuvastatin (CRESTOR) 20 MG tablet   lisinopril (ZESTRIL) 20 MG tablet   Other Relevant Orders   CBC with Differential/Platelet   Comprehensive metabolic panel An individual hypertension care plan was established and reinforced today.  The patient's status was assessed using clinical findings on exam and labs or diagnostic tests. The patient's success at meeting treatment goals on disease specific evidence-based guidelines and found to be well controlled. SELF MANAGEMENT: The patient and I together assessed ways to personally work towards obtaining the recommended goals. A CLINICAL SUMMARY including written plan identify barriers to care unique to individual due to social or financial issues.  We attempt to mutually creat solutions for individual and family understanding.          Follow-up: Return in  about 6 months (around 05/14/2021) for fasting.    Brent Bulla, MD

## 2020-11-13 LAB — CBC WITH DIFFERENTIAL/PLATELET
Basophils Absolute: 0 10*3/uL (ref 0.0–0.2)
Basos: 0 %
EOS (ABSOLUTE): 0.1 10*3/uL (ref 0.0–0.4)
Eos: 2 %
Hematocrit: 44.9 % (ref 37.5–51.0)
Hemoglobin: 15.1 g/dL (ref 13.0–17.7)
Immature Grans (Abs): 0 10*3/uL (ref 0.0–0.1)
Immature Granulocytes: 0 %
Lymphocytes Absolute: 1.8 10*3/uL (ref 0.7–3.1)
Lymphs: 20 %
MCH: 28.5 pg (ref 26.6–33.0)
MCHC: 33.6 g/dL (ref 31.5–35.7)
MCV: 85 fL (ref 79–97)
Monocytes Absolute: 0.7 10*3/uL (ref 0.1–0.9)
Monocytes: 8 %
Neutrophils Absolute: 6 10*3/uL (ref 1.4–7.0)
Neutrophils: 70 %
Platelets: 253 10*3/uL (ref 150–450)
RBC: 5.29 x10E6/uL (ref 4.14–5.80)
RDW: 13.1 % (ref 11.6–15.4)
WBC: 8.7 10*3/uL (ref 3.4–10.8)

## 2020-11-13 LAB — LIPID PANEL
Chol/HDL Ratio: 6.3 ratio — ABNORMAL HIGH (ref 0.0–5.0)
Cholesterol, Total: 222 mg/dL — ABNORMAL HIGH (ref 100–199)
HDL: 35 mg/dL — ABNORMAL LOW (ref >39)
LDL Chol Calc (NIH): 139 mg/dL — ABNORMAL HIGH (ref 0–99)
Triglycerides: 267 mg/dL — ABNORMAL HIGH (ref 0–149)
VLDL Cholesterol Cal: 48 mg/dL — ABNORMAL HIGH (ref 5–40)

## 2020-11-13 LAB — COMPREHENSIVE METABOLIC PANEL
ALT: 27 IU/L (ref 0–44)
AST: 15 IU/L (ref 0–40)
Albumin/Globulin Ratio: 1.6 (ref 1.2–2.2)
Albumin: 4.3 g/dL (ref 4.0–5.0)
Alkaline Phosphatase: 83 IU/L (ref 44–121)
BUN/Creatinine Ratio: 12 (ref 9–20)
BUN: 12 mg/dL (ref 6–24)
Bilirubin Total: 0.2 mg/dL (ref 0.0–1.2)
CO2: 22 mmol/L (ref 20–29)
Calcium: 9.3 mg/dL (ref 8.7–10.2)
Chloride: 100 mmol/L (ref 96–106)
Creatinine, Ser: 1.02 mg/dL (ref 0.76–1.27)
Globulin, Total: 2.7 g/dL (ref 1.5–4.5)
Glucose: 77 mg/dL (ref 65–99)
Potassium: 4.9 mmol/L (ref 3.5–5.2)
Sodium: 139 mmol/L (ref 134–144)
Total Protein: 7 g/dL (ref 6.0–8.5)
eGFR: 94 mL/min/{1.73_m2} (ref >59)

## 2020-11-13 LAB — CARDIOVASCULAR RISK ASSESSMENT

## 2020-11-13 NOTE — Progress Notes (Signed)
Mild anemia, platelets low 120 chronic, Kidney and liver tests normal, LDL cholesterol 139- is he taking crestor daily?,  lp

## 2020-11-20 ENCOUNTER — Encounter: Payer: Self-pay | Admitting: Legal Medicine

## 2020-11-20 DIAGNOSIS — R109 Unspecified abdominal pain: Secondary | ICD-10-CM

## 2020-11-23 MED ORDER — LINACLOTIDE 72 MCG PO CAPS: 72.0000 ug | ORAL_CAPSULE | Freq: Every day | ORAL | 3 refills | Status: DC

## 2020-12-10 ENCOUNTER — Other Ambulatory Visit: Payer: Self-pay

## 2020-12-10 ENCOUNTER — Ambulatory Visit (INDEPENDENT_AMBULATORY_CARE_PROVIDER_SITE_OTHER): Payer: No Typology Code available for payment source | Admitting: Legal Medicine

## 2020-12-10 ENCOUNTER — Encounter: Payer: Self-pay | Admitting: Legal Medicine

## 2020-12-10 VITALS — BP 110/80 | HR 101 | Temp 98.1°F | Resp 15 | Ht 70.0 in | Wt 224.0 lb

## 2020-12-10 DIAGNOSIS — I10 Essential (primary) hypertension: Secondary | ICD-10-CM | POA: Diagnosis not present

## 2020-12-10 DIAGNOSIS — Z0000 Encounter for general adult medical examination without abnormal findings: Secondary | ICD-10-CM | POA: Diagnosis not present

## 2020-12-10 DIAGNOSIS — E782 Mixed hyperlipidemia: Secondary | ICD-10-CM

## 2020-12-10 NOTE — Progress Notes (Signed)
Established Patient Office Visit  Subjective:  Patient ID: Kenneth Hernandez, male    DOB: 08/23/1980  Age: 40 y.o. MRN: [id number]  CC:  Chief Complaint  Patient presents with   Annual Exam    HPI Kenneth Hernandez presents for wellness physical  Patient presents for follow up of hypertension.  Patient tolerating lisinopril well with side effects.  Patient was diagnosed with hypertension 2010 so has been treated for hypertension for 10 years.Patient is working on maintaining diet and exercise regimen and follows up as directed. Complication include non  Patient presents with hyperlipidemia.  Compliance with treatment has been good; patient takes medicines as directed, maintains low cholesterol diet, follows up as directed, and maintains exercise regimen.  Patient is using crestor without problems. .   Past Medical History:  Diagnosis Date   Adjustment disorder with depressed mood    Body mass index (BMI) of 20.0-20.9 in adult    Diverticulitis 2008   Generalized anxiety disorder    Mixed hyperlipidemia     Past Surgical History:  Procedure Laterality Date   reconstruction of right thumb      Family History  Problem Relation Age of Onset   Hyperlipidemia Mother    Hypertension Mother    Hypertension Father    Hyperlipidemia Father    Colon cancer Father    Colon cancer Paternal Grandmother     Social History   Socioeconomic History   Marital status: Married    Spouse name: Not on file   Number of children: 3   Years of education: Not on file   Highest education level: Not on file  Occupational History   Occupation: Sales    Comment: Hydrologist  Tobacco Use   Smoking status: Never   Smokeless tobacco: Current    Types: Designer, multimedia Use   Vaping Use: Unknown  Substance and Sexual Activity   Alcohol use: Yes   Drug use: No   Sexual activity: Yes    Partners: Female  Other Topics Concern   Not on file  Social History Narrative   Not on file   Social  Determinants of Health   Financial Resource Strain: Not on file  Food Insecurity: Not on file  Transportation Needs: Not on file  Physical Activity: Not on file  Stress: Not on file  Social Connections: Not on file  Intimate Partner Violence: Not on file    Outpatient Medications Prior to Visit  Medication Sig Dispense Refill   cetirizine (ZYRTEC) 10 MG tablet Take 10 mg by mouth daily.     diphenhydrAMINE (BENADRYL) 50 MG tablet Take 50 mg by mouth at bedtime as needed for itching.     lisinopril (ZESTRIL) 20 MG tablet Take 1 tablet (20 mg total) by mouth daily. 90 tablet 2   loperamide (IMODIUM) 2 MG capsule Take by mouth as needed for diarrhea or loose stools.     magnesium hydroxide (MILK OF MAGNESIA) 400 MG/5ML suspension Take by mouth daily as needed for mild constipation.     omeprazole (PRILOSEC) 20 MG capsule Take 20 mg by mouth 2 (two) times daily before a meal.     ondansetron (ZOFRAN) 4 MG tablet Take 4 mg by mouth every 8 (eight) hours as needed for nausea or vomiting.     rosuvastatin (CRESTOR) 20 MG tablet Take 1 tablet (20 mg total) by mouth daily. 90 tablet 2   sodium phosphate Pediatric (FLEET) 3.5-9.5 GM/59ML enema Place 1 enema rectally once.  linaclotide (LINZESS) 72 MCG capsule Take 1 capsule (72 mcg total) by mouth daily before breakfast. 30 capsule 3   Vilazodone HCl (VIIBRYD) 20 MG TABS Take 1 tablet (20 mg total) by mouth daily. 90 tablet 1   No facility-administered medications prior to visit.    Allergies  Allergen Reactions   Morphine Nausea And Vomiting   Penicillins Other (See Comments) and Nausea And Vomiting    unknown Possible rash childhood allergy     ROS Review of Systems  Constitutional:  Negative for chills, fatigue and fever.  HENT:  Negative for congestion, ear pain and sore throat.   Respiratory:  Negative for cough and shortness of breath.   Cardiovascular:  Negative for chest pain.  Gastrointestinal:  Negative for abdominal  pain, constipation, diarrhea, nausea and vomiting.  Endocrine: Negative for polydipsia, polyphagia and polyuria.  Genitourinary:  Negative for dysuria and frequency.  Musculoskeletal:  Negative for arthralgias and myalgias.  Neurological:  Negative for dizziness and headaches.  Psychiatric/Behavioral:  Negative for dysphoric mood.        No dysphoria     Objective:    Physical Exam Vitals reviewed.  Constitutional:      General: He is not in acute distress.    Appearance: Normal appearance.  HENT:     Head: Normocephalic.     Right Ear: Tympanic membrane, ear canal and external ear normal.     Left Ear: Tympanic membrane, ear canal and external ear normal.     Mouth/Throat:     Mouth: Mucous membranes are moist.     Pharynx: Oropharynx is clear.  Eyes:     Extraocular Movements: Extraocular movements intact.     Conjunctiva/sclera: Conjunctivae normal.     Pupils: Pupils are equal, round, and reactive to light.  Cardiovascular:     Rate and Rhythm: Normal rate and regular rhythm.     Pulses: Normal pulses.     Heart sounds: Normal heart sounds. No murmur heard.   No gallop.  Pulmonary:     Effort: Pulmonary effort is normal. No respiratory distress.     Breath sounds: No wheezing.  Abdominal:     General: Abdomen is flat. Bowel sounds are normal. There is no distension.     Tenderness: There is no abdominal tenderness.  Musculoskeletal:        General: Normal range of motion.     Cervical back: Normal range of motion and neck supple.  Skin:    General: Skin is warm.     Capillary Refill: Capillary refill takes less than 2 seconds.  Neurological:     General: No focal deficit present.     Mental Status: He is alert and oriented to person, place, and time. Mental status is at baseline.  Psychiatric:        Mood and Affect: Mood normal.        Thought Content: Thought content normal.        Judgment: Judgment normal.    BP 110/80   Pulse (!) 101   Temp 98.1 F  (36.7 C)   Resp 15   Ht $R'5\' 10"'uu$  (1.778 m)   Wt 224 lb (101.6 kg)   SpO2 97%   BMI 32.14 kg/m  Wt Readings from Last 3 Encounters:  12/10/20 224 lb (101.6 kg)  11/12/20 228 lb (103.4 kg)  05/08/14 233 lb 4 oz (105.8 kg)     Health Maintenance Due  Topic Date Due   COVID-19 Vaccine (1) Never  done   HIV Screening  Never done   Hepatitis C Screening  Never done   TETANUS/TDAP  Never done    There are no preventive care reminders to display for this patient.  Lab Results  Component Value Date   TSH 4.807 (H) 05/08/2014   Lab Results  Component Value Date   WBC 8.7 11/12/2020   HGB 15.1 11/12/2020   HCT 44.9 11/12/2020   MCV 85 11/12/2020   PLT 253 11/12/2020   Lab Results  Component Value Date   NA 139 11/12/2020   K 4.9 11/12/2020   CO2 22 11/12/2020   GLUCOSE 77 11/12/2020   BUN 12 11/12/2020   CREATININE 1.02 11/12/2020   BILITOT 0.2 11/12/2020   ALKPHOS 83 11/12/2020   AST 15 11/12/2020   ALT 27 11/12/2020   PROT 7.0 11/12/2020   ALBUMIN 4.3 11/12/2020   CALCIUM 9.3 11/12/2020   ANIONGAP 5 05/10/2014   EGFR 94 11/12/2020   Lab Results  Component Value Date   CHOL 222 (H) 11/12/2020   Lab Results  Component Value Date   HDL 35 (L) 11/12/2020   Lab Results  Component Value Date   LDLCALC 139 (H) 11/12/2020   Lab Results  Component Value Date   TRIG 267 (H) 11/12/2020   Lab Results  Component Value Date   CHOLHDL 6.3 (H) 11/12/2020   No results found for: HGBA1C    Assessment & Plan:   Problem List Items Addressed This Visit       Cardiovascular and Mediastinum   Essential hypertension, benign   Relevant Orders   CBC with Differential/Platelet   Comprehensive metabolic panel An individual hypertension care plan was established and reinforced today.  The patient's status was assessed using clinical findings on exam and labs or diagnostic tests. The patient's success at meeting treatment goals on disease specific evidence-based  guidelines and found to be fair controlled. SELF MANAGEMENT: The patient and I together assessed ways to personally work towards obtaining the recommended goals.  RECOMMENDATIONS: avoid decongestants found in common cold remedies, decrease consumption of alcohol, perform routine monitoring of BP with home BP cuff, exercise, reduction of dietary salt, take medicines as prescribed, try not to miss doses and quit smoking.  Regular exercise and maintaining a healthy weight is needed.  Stress reduction may help. A CLINICAL SUMMARY including written plan identify barriers to care unique to individual due to social or financial issues.  We attempt to mutually creat solutions for individual and family understanding.      Other   Mixed hyperlipidemia - Primary (Chronic)   Relevant Orders   Lipid panel AN INDIVIDUAL CARE PLAN for hyperlipidemia/ cholesterol was established and reinforced today.  The patient's status was assessed using clinical findings on exam, lab and other diagnostic tests. The patient's disease status was assessed based on evidence-based guidelines and found to be fair controlled. MEDICATIONS were reviewed. SELF MANAGEMENT GOALS have been discussed and patient's success at attaining the goal of low cholesterol was assessed. RECOMMENDATION given include regular exercise 3 days a week and low cholesterol/low fat diet. CLINICAL SUMMARY including written plan to identify barriers unique to the patient due to social or economic  reasons was discussed.     Routine general medical examination at a health care facility Physical performed for work.      Follow-up: Return if symptoms worsen or fail to improve.    Reinaldo Meeker, MD

## 2020-12-11 LAB — CBC WITH DIFFERENTIAL/PLATELET
Basophils Absolute: 0 10*3/uL (ref 0.0–0.2)
Basos: 0 %
EOS (ABSOLUTE): 0.1 10*3/uL (ref 0.0–0.4)
Eos: 1 %
Hematocrit: 46.9 % (ref 37.5–51.0)
Hemoglobin: 15.6 g/dL (ref 13.0–17.7)
Immature Grans (Abs): 0 10*3/uL (ref 0.0–0.1)
Immature Granulocytes: 0 %
Lymphocytes Absolute: 1.3 10*3/uL (ref 0.7–3.1)
Lymphs: 18 %
MCH: 28.6 pg (ref 26.6–33.0)
MCHC: 33.3 g/dL (ref 31.5–35.7)
MCV: 86 fL (ref 79–97)
Monocytes Absolute: 0.8 10*3/uL (ref 0.1–0.9)
Monocytes: 11 %
Neutrophils Absolute: 5 10*3/uL (ref 1.4–7.0)
Neutrophils: 70 %
Platelets: 208 10*3/uL (ref 150–450)
RBC: 5.45 x10E6/uL (ref 4.14–5.80)
RDW: 13.2 % (ref 11.6–15.4)
WBC: 7.2 10*3/uL (ref 3.4–10.8)

## 2020-12-11 LAB — COMPREHENSIVE METABOLIC PANEL
ALT: 30 IU/L (ref 0–44)
AST: 19 IU/L (ref 0–40)
Albumin/Globulin Ratio: 2.1 (ref 1.2–2.2)
Albumin: 5 g/dL (ref 4.0–5.0)
Alkaline Phosphatase: 92 IU/L (ref 44–121)
BUN/Creatinine Ratio: 11 (ref 9–20)
BUN: 12 mg/dL (ref 6–24)
Bilirubin Total: 0.5 mg/dL (ref 0.0–1.2)
CO2: 24 mmol/L (ref 20–29)
Calcium: 10 mg/dL (ref 8.7–10.2)
Chloride: 100 mmol/L (ref 96–106)
Creatinine, Ser: 1.06 mg/dL (ref 0.76–1.27)
Globulin, Total: 2.4 g/dL (ref 1.5–4.5)
Glucose: 84 mg/dL (ref 65–99)
Potassium: 5.7 mmol/L — ABNORMAL HIGH (ref 3.5–5.2)
Sodium: 139 mmol/L (ref 134–144)
Total Protein: 7.4 g/dL (ref 6.0–8.5)
eGFR: 90 mL/min/{1.73_m2} (ref >59)

## 2020-12-11 LAB — CARDIOVASCULAR RISK ASSESSMENT

## 2020-12-11 LAB — LIPID PANEL
Chol/HDL Ratio: 4.2 ratio (ref 0.0–5.0)
Cholesterol, Total: 150 mg/dL (ref 100–199)
HDL: 36 mg/dL — ABNORMAL LOW (ref >39)
LDL Chol Calc (NIH): 91 mg/dL (ref 0–99)
Triglycerides: 129 mg/dL (ref 0–149)
VLDL Cholesterol Cal: 23 mg/dL (ref 5–40)

## 2020-12-11 NOTE — Progress Notes (Signed)
CBC normal, potassium high 5.7, recheck one week, kidney and liver tests normal, cholesterol normal,  lp

## 2021-02-22 ENCOUNTER — Encounter: Payer: Self-pay | Admitting: Legal Medicine

## 2021-02-22 ENCOUNTER — Other Ambulatory Visit: Payer: Self-pay | Admitting: Family Medicine

## 2021-02-22 MED ORDER — SCOPOLAMINE 1 MG/3DAYS TD PT72: 1.0000 | MEDICATED_PATCH | TRANSDERMAL | 0 refills | Status: DC

## 2021-06-15 ENCOUNTER — Telehealth: Payer: BC Managed Care – PPO | Admitting: Physician Assistant

## 2021-06-15 DIAGNOSIS — A084 Viral intestinal infection, unspecified: Secondary | ICD-10-CM

## 2021-06-15 MED ORDER — ONDANSETRON 4 MG PO TBDP: 4.0000 mg | ORAL_TABLET | Freq: Three times a day (TID) | ORAL | 0 refills | Status: AC | PRN

## 2021-06-15 NOTE — Patient Instructions (Signed)
Jairo Ben, thank you for joining Piedad Climes, PA-C for today's virtual visit.  While this provider is not your primary care provider (PCP), if your PCP is located in our provider database this encounter information will be shared with them immediately following your visit.  Consent: (Patient) Kenneth Hernandez provided verbal consent for this virtual visit at the beginning of the encounter.  Current Medications:  Current Outpatient Medications:    ondansetron (ZOFRAN-ODT) 4 MG disintegrating tablet, Take 1 tablet (4 mg total) by mouth every 8 (eight) hours as needed for nausea or vomiting., Disp: 20 tablet, Rfl: 0   cetirizine (ZYRTEC) 10 MG tablet, Take 10 mg by mouth daily., Disp: , Rfl:    diphenhydrAMINE (BENADRYL) 50 MG tablet, Take 50 mg by mouth at bedtime as needed for itching., Disp: , Rfl:    lisinopril (ZESTRIL) 20 MG tablet, Take 1 tablet (20 mg total) by mouth daily., Disp: 90 tablet, Rfl: 2   loperamide (IMODIUM) 2 MG capsule, Take by mouth as needed for diarrhea or loose stools., Disp: , Rfl:    magnesium hydroxide (MILK OF MAGNESIA) 400 MG/5ML suspension, Take by mouth daily as needed for mild constipation., Disp: , Rfl:    omeprazole (PRILOSEC) 20 MG capsule, Take 20 mg by mouth 2 (two) times daily before a meal., Disp: , Rfl:    rosuvastatin (CRESTOR) 20 MG tablet, Take 1 tablet (20 mg total) by mouth daily., Disp: 90 tablet, Rfl: 2   sodium phosphate Pediatric (FLEET) 3.5-9.5 GM/59ML enema, Place 1 enema rectally once., Disp: , Rfl:    Medications ordered in this encounter:  Meds ordered this encounter  Medications   ondansetron (ZOFRAN-ODT) 4 MG disintegrating tablet    Sig: Take 1 tablet (4 mg total) by mouth every 8 (eight) hours as needed for nausea or vomiting.    Dispense:  20 tablet    Refill:  0    Order Specific Question:   Supervising Provider    Answer:   Hyacinth Meeker, BRIAN [[id number]]     *If you need refills on other medications prior to your next appointment,  please contact your pharmacy*  Follow-Up: Call back or seek an in-person evaluation if the symptoms worsen or if the condition fails to improve as anticipated.  Other Instructions Please keep well-hydrated and get plenty of rest. Start a daily probiotic. Use the zofran as directed for nausea.  You can use OTC immodium if diarrhea begins. Follow dietary recommendations below. If unable to keep fluids in, you need to be evaluated at nearest ER.   Bland Diet A bland diet consists of foods that are often soft and do not have a lot of fat, fiber, or extra seasonings. Foods without fat, fiber, or seasoning are easier for the body to digest. They are also less likely to irritate your mouth, throat, stomach, and other parts of your digestive system. A bland diet is sometimes called a BRAT diet. What is my plan? Your health care provider or food and nutrition specialist (dietitian) may recommend specific changes to your diet to prevent symptoms or to treat your symptoms. These changes may include: Eating small meals often. Cooking food until it is soft enough to chew easily. Chewing your food well. Drinking fluids slowly. Not eating foods that are very spicy, sour, or fatty. Not eating citrus fruits, such as oranges and grapefruit. What do I need to know about this diet? Eat a variety of foods from the bland diet food list. Do not follow a  bland diet longer than needed. Ask your health care provider whether you should take vitamins or supplements. What foods can I eat? Grains Hot cereals, such as cream of wheat. Rice. Bread, crackers, or tortillas made from refined white flour. Vegetables Canned or cooked vegetables. Mashed or boiled potatoes. Fruits Bananas. Applesauce. Other types of cooked or canned fruit with the skin and seeds removed, such as canned peaches or pears. Meats and other proteins Scrambled eggs. Creamy peanut butter or other nut butters. Lean, well-cooked meats, such as  chicken or fish. Tofu. Soups or broths. Dairy Low-fat dairy products, such as milk, cottage cheese, or yogurt. Beverages Water. Herbal tea. Apple juice. Fats and oils Mild salad dressings. Canola or olive oil. Sweets and desserts Pudding. Custard. Fruit gelatin. Ice cream. The items listed above may not be a complete list of recommended foods and beverages. Contact a dietitian for more options. What foods are not recommended? Grains Whole grain breads and cereals. Vegetables Raw vegetables. Fruits Raw fruits, especially citrus, berries, or dried fruits. Dairy Whole fat dairy foods. Beverages Caffeinated drinks. Alcohol. Seasonings and condiments Strongly flavored seasonings or condiments. Hot sauce. Salsa. Other foods Spicy foods. Fried foods. Sour foods, such as pickled or fermented foods. Foods with high sugar content. Foods high in fiber. The items listed above may not be a complete list of foods and beverages to avoid. Contact a dietitian for more information. Summary A bland diet consists of foods that are often soft and do not have a lot of fat, fiber, or extra seasonings. Foods without fat, fiber, or seasoning are easier for the body to digest. Check with your health care provider to see how long you should follow this diet plan. It is not meant to be followed for long periods. This information is not intended to replace advice given to you by your health care provider. Make sure you discuss any questions you have with your health care provider. Document Revised: 05/31/2017 Document Reviewed: 05/31/2017 Elsevier Patient Education  2022 [employer].    If you have been instructed to have an in-person evaluation today at a local Urgent Care facility, please use the link below. It will take you to a list of all of our available [facility], including address, phone number and hours of operation. Please do not delay care.  [facility]  If you or a  family member do not have a primary care provider, use the link below to schedule a visit and establish care. When you choose a [facility] primary care physician or advanced practice provider, you gain a long-term partner in health. Find a Primary Care Provider  Learn more about [facility] in-office and virtual care options: [facility] - Get Care Now

## 2021-06-15 NOTE — Progress Notes (Signed)
Virtual Visit Consent   Adriaan Maltese, you are scheduled for a virtual visit with a [facility] provider today.     Just as with appointments in the office, your consent must be obtained to participate.  Your consent will be active for this visit and any virtual visit you may have with one of our providers in the next 365 days.     If you have a MyChart account, a copy of this consent can be sent to you electronically.  All virtual visits are billed to your insurance company just like a traditional visit in the office.    As this is a virtual visit, video technology does not allow for your provider to perform a traditional examination.  This may limit your provider's ability to fully assess your condition.  If your provider identifies any concerns that need to be evaluated in person or the need to arrange testing (such as labs, EKG, etc.), we will make arrangements to do so.     Although advances in technology are sophisticated, we cannot ensure that it will always work on either your end or our end.  If the connection with a video visit is poor, the visit may have to be switched to a telephone visit.  With either a video or telephone visit, we are not always able to ensure that we have a secure connection.     I need to obtain your verbal consent now.   Are you willing to proceed with your visit today?    Kenneth Hernandez has provided verbal consent on 06/15/2021 for a virtual visit (video or telephone).   Piedad Climes, [place]   Date: 06/15/2021 11:03 AM   Virtual Visit via Video Note   I, Piedad Climes, connected with  Kenneth Hernandez  ([id number], 1981-04-17) on 06/15/21 at 11:00 AM EST by a video-enabled telemedicine application and verified that I am speaking with the correct person using two identifiers.  Location: Patient: Virtual Visit Location Patient: Home Provider: Virtual Visit Location Provider: Home Office   I discussed the limitations of evaluation and management by  telemedicine and the availability of in person appointments. The patient expressed understanding and agreed to proceed.    History of Present Illness: Kenneth Hernandez is a 41 y.o. who identifies as a male who was assigned male at birth, and is being seen today for possible stomach bug. Notes this has been going around the house as 2 of his 4 children have been dealing with it this past week. As of this morning, patient notes significant nausea without vomiting and mild stomach upset. Denies fever, chills, aches. Is trying to stay well-hydrated but nausea makes this harder. Kids are getting better now. He is wondering if there is something for nausea.    HPI: HPI  Problems:  Patient Active Problem List   Diagnosis Date Noted   Routine general medical examination at a health care facility 12/10/2020   Supraventricular tachycardia (HCC) 11/12/2020   BMI 32.0-32.9,adult 11/12/2020   Adjustment disorder with depressed mood 06/16/2019   Essential hypertension, benign 06/16/2019   Mixed hyperlipidemia 06/16/2019   Nicotine dependence 06/16/2019   Abdominal pain    Nausea and vomiting 05/08/2014   Sinus tachycardia 05/08/2014   Febrile illness 05/08/2014   Sepsis (HCC) 05/08/2014    Allergies:  Allergies  Allergen Reactions   Morphine Nausea And Vomiting   Penicillins Other (See Comments) and Nausea And Vomiting    unknown Possible rash childhood allergy  Medications:  Current Outpatient Medications:    ondansetron (ZOFRAN-ODT) 4 MG disintegrating tablet, Take 1 tablet (4 mg total) by mouth every 8 (eight) hours as needed for nausea or vomiting., Disp: 20 tablet, Rfl: 0   cetirizine (ZYRTEC) 10 MG tablet, Take 10 mg by mouth daily., Disp: , Rfl:    diphenhydrAMINE (BENADRYL) 50 MG tablet, Take 50 mg by mouth at bedtime as needed for itching., Disp: , Rfl:    lisinopril (ZESTRIL) 20 MG tablet, Take 1 tablet (20 mg total) by mouth daily., Disp: 90 tablet, Rfl: 2   loperamide (IMODIUM) 2 MG  capsule, Take by mouth as needed for diarrhea or loose stools., Disp: , Rfl:    magnesium hydroxide (MILK OF MAGNESIA) 400 MG/5ML suspension, Take by mouth daily as needed for mild constipation., Disp: , Rfl:    omeprazole (PRILOSEC) 20 MG capsule, Take 20 mg by mouth 2 (two) times daily before a meal., Disp: , Rfl:    rosuvastatin (CRESTOR) 20 MG tablet, Take 1 tablet (20 mg total) by mouth daily., Disp: 90 tablet, Rfl: 2   sodium phosphate Pediatric (FLEET) 3.5-9.5 GM/59ML enema, Place 1 enema rectally once., Disp: , Rfl:   Observations/Objective: Patient is well-developed, well-nourished in no acute distress.  Resting comfortably at home.  Head is normocephalic, atraumatic No labored breathing. Speech is clear and coherent with logical content.  Patient is alert and oriented at baseline.   Assessment and Plan: 1. Viral gastroenteritis - ondansetron (ZOFRAN-ODT) 4 MG disintegrating tablet; Take 1 tablet (4 mg total) by mouth every 8 (eight) hours as needed for nausea or vomiting.  Dispense: 20 tablet; Refill: 0  Recent onset of symptoms for patient. Multiple family member with similar symptoms for longer. No alarm signs/symptoms present. Supportive measures and OTC medications reviewed. Rx zofran ODT. Strict ER precautions discussed. Work note declined as he can work from home.   Follow Up Instructions: I discussed the assessment and treatment plan with the patient. The patient was provided an opportunity to ask questions and all were answered. The patient agreed with the plan and demonstrated an understanding of the instructions.  A copy of instructions were sent to the patient via MyChart unless otherwise noted below.   The patient was advised to call back or seek an in-person evaluation if the symptoms worsen or if the condition fails to improve as anticipated.  Time:  I spent 8 minutes with the patient via telehealth technology discussing the above problems/concerns.    Piedad Climes, PA-C

## 2021-09-28 ENCOUNTER — Other Ambulatory Visit: Payer: Self-pay | Admitting: Legal Medicine

## 2021-09-28 DIAGNOSIS — I10 Essential (primary) hypertension: Secondary | ICD-10-CM

## 2021-09-28 NOTE — Telephone Encounter (Signed)
Refill sent to pharmacy.   

## 2022-07-18 ENCOUNTER — Ambulatory Visit (INDEPENDENT_AMBULATORY_CARE_PROVIDER_SITE_OTHER): Payer: BLUE CROSS/BLUE SHIELD | Admitting: Behavioral Health

## 2022-07-18 ENCOUNTER — Encounter: Payer: Self-pay | Admitting: Behavioral Health

## 2022-07-18 VITALS — BP 135/79 | HR 116 | Ht 70.0 in | Wt 222.0 lb

## 2022-07-18 DIAGNOSIS — Z630 Problems in relationship with spouse or partner: Secondary | ICD-10-CM | POA: Diagnosis not present

## 2022-07-18 DIAGNOSIS — F411 Generalized anxiety disorder: Secondary | ICD-10-CM | POA: Diagnosis not present

## 2022-07-18 MED ORDER — VILAZODONE HCL 20 MG PO TABS: 20.0000 mg | ORAL_TABLET | Freq: Every day | ORAL | 1 refills | Status: DC
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
## 2022-08-15 ENCOUNTER — Encounter: Payer: Self-pay | Admitting: Behavioral Health

## 2022-08-15 ENCOUNTER — Ambulatory Visit (INDEPENDENT_AMBULATORY_CARE_PROVIDER_SITE_OTHER): Payer: BLUE CROSS/BLUE SHIELD | Admitting: Behavioral Health

## 2022-08-15 DIAGNOSIS — Z630 Problems in relationship with spouse or partner: Secondary | ICD-10-CM | POA: Diagnosis not present

## 2022-08-15 DIAGNOSIS — F411 Generalized anxiety disorder: Secondary | ICD-10-CM

## 2022-08-15 DIAGNOSIS — G4709 Other insomnia: Secondary | ICD-10-CM | POA: Diagnosis not present

## 2022-08-15 MED ORDER — TRAZODONE HCL 50 MG PO TABS: 50.0000 mg | ORAL_TABLET | Freq: Every day | ORAL | 1 refills | Status: DC

## 2022-08-15 NOTE — Progress Notes (Signed)
Newton Hajj [id number] [birth year] 42 y.o.  Virtual Visit via Telephone Note  I connected with pt on 08/15/22 at  8:30 AM EDT by telephone and verified that I am speaking with the correct person using two identifiers.   I discussed the limitations, risks, security and privacy concerns of performing an evaluation and management service by telephone and the availability of in person appointments. I also discussed with the patient that there may be a patient responsible charge related to this service. The patient expressed understanding and agreed to proceed.   I discussed the assessment and treatment plan with the patient. The patient was provided an opportunity to ask questions and all were answered. The patient agreed with the plan and demonstrated an understanding of the instructions.   The patient was advised to call back or seek an in-person evaluation if the symptoms worsen or if the condition fails to improve as anticipated.  I provided 30 minutes of non-face-to-face time during this encounter.  The patient was located at home.  The provider was located at [place].   Elwanda Brooklyn, NP   Subjective:   Patient ID:  Kenneth Hernandez is a 42 y.o. (DOB 1981-03-26) male.  Chief Complaint:  Chief Complaint  Patient presents with   Anxiety   Family Problem   Depression   Medication Problem   Medication Refill   Patient Education    HPI "Kenneth Hernandez", 42 year old male presents to this office via telephonic interview for follow up and and medication management. Says that he likes Viibryd and the medication has made him feel much better. Says it has also made a difference in his relationship with his wife.  Says he is having problems with urinary frequency but not sure it is the medication. He will f/u with his medical doctor.    He says that his anxiety today is 3/10, and depression is 1/10.  He is sleeping 7 hours of broken sleep due to getting up frequently to urinate.    He denies any  previous history of psychosis, no auditory or visual hallucinations.  No SI or HI.   Past psychiatric medication trials: Viibryd Zoloft Lexapro Depakote Seroquel   Review of Systems:  Review of Systems  Constitutional: Negative.   Allergic/Immunologic: Negative.   Psychiatric/Behavioral:  Negative for dysphoric mood.     Medications: I have reviewed the patient's current medications.  Current Outpatient Medications  Medication Sig Dispense Refill   cetirizine (ZYRTEC) 10 MG tablet Take 10 mg by mouth daily.     diphenhydrAMINE (BENADRYL) 50 MG tablet Take 50 mg by mouth at bedtime as needed for itching.     lisinopril (ZESTRIL) 20 MG tablet TAKE 1 TABLET BY MOUTH EVERY DAY 90 tablet 2   loperamide (IMODIUM) 2 MG capsule Take by mouth as needed for diarrhea or loose stools.     magnesium hydroxide (MILK OF MAGNESIA) 400 MG/5ML suspension Take by mouth daily as needed for mild constipation.     omeprazole (PRILOSEC) 20 MG capsule Take 20 mg by mouth 2 (two) times daily before a meal.     ondansetron (ZOFRAN-ODT) 4 MG disintegrating tablet Take 1 tablet (4 mg total) by mouth every 8 (eight) hours as needed for nausea or vomiting. 20 tablet 0   rosuvastatin (CRESTOR) 20 MG tablet Take 1 tablet (20 mg total) by mouth daily. 90 tablet 2   sodium phosphate Pediatric (FLEET) 3.5-9.5 GM/59ML enema Place 1 enema rectally once.     Vilazodone HCl 20 MG  TABS Take 1 tablet (20 mg total) by mouth daily after breakfast. 30 tablet 1   No current facility-administered medications for this visit.    Medication Side Effects: None  Allergies:  Allergies  Allergen Reactions   Morphine Nausea And Vomiting   Penicillins Other (See Comments) and Nausea And Vomiting    unknown Possible rash childhood allergy     Past Medical History:  Diagnosis Date   Adjustment disorder with depressed mood    Body mass index (BMI) of 20.0-20.9 in adult    Diverticulitis 2008   Generalized anxiety disorder     Mixed hyperlipidemia     Family History  Problem Relation Age of Onset   Bipolar disorder Mother    Hyperlipidemia Mother    Hypertension Mother    Hypertension Father    Hyperlipidemia Father    Colon cancer Father    Alcohol abuse Brother    Colon cancer Paternal Grandmother     Social History   Socioeconomic History   Marital status: Married    Spouse name: Lauren   Number of children: 3   Years of education: Not on file   Highest education level: Some college, no degree  Occupational History   Occupation: Press photographer    Comment: Sales executive  Tobacco Use   Smoking status: Never   Smokeless tobacco: Current    Types: Chew  Vaping Use   Vaping Use: Never used  Substance and Sexual Activity   Alcohol use: Yes    Comment: light social   Drug use: No   Sexual activity: Yes    Partners: Female  Other Topics Concern   Not on file  Social History Narrative   Lives in [place] with wife and 4 children. Likes to play golf and read in spare time.    Social Determinants of Health   Financial Resource Strain: Not on file  Food Insecurity: Not on file  Transportation Needs: Not on file  Physical Activity: Not on file  Stress: Not on file  Social Connections: Not on file  Intimate Partner Violence: Not on file    Past Medical History, Surgical history, Social history, and Family history were reviewed and updated as appropriate.   Please see review of systems for further details on the patient's review from today.   Objective:   Physical Exam:  There were no vitals taken for this visit.  Physical Exam Constitutional:      General: He is not in acute distress.    Appearance: Normal appearance.  Neurological:     Mental Status: He is alert and oriented to person, place, and time.     Gait: Gait normal.  Psychiatric:        Attention and Perception: Attention and perception normal. He does not perceive auditory or visual hallucinations.        Mood and  Affect: Mood and affect normal. Mood is not anxious or depressed. Affect is not labile.        Speech: Speech normal.        Behavior: Behavior normal. Behavior is cooperative.        Thought Content: Thought content normal.        Cognition and Memory: Cognition and memory normal.        Judgment: Judgment normal.     Lab Review:     Component Value Date/Time   NA 139 12/10/2020 0904   K 5.7 (H) 12/10/2020 0904   CL 100 12/10/2020 0904  CO2 24 12/10/2020 0904   GLUCOSE 84 12/10/2020 0904   GLUCOSE 86 05/10/2014 0440   BUN 12 12/10/2020 0904   CREATININE 1.06 12/10/2020 0904   CALCIUM 10.0 12/10/2020 0904   PROT 7.4 12/10/2020 0904   ALBUMIN 5.0 12/10/2020 0904   AST 19 12/10/2020 0904   ALT 30 12/10/2020 0904   ALKPHOS 92 12/10/2020 0904   BILITOT 0.5 12/10/2020 0904   GFRNONAA >90 05/10/2014 0440   GFRAA >90 05/10/2014 0440       Component Value Date/Time   WBC 7.2 12/10/2020 0904   WBC 4.5 05/10/2014 0440   RBC 5.45 12/10/2020 0904   RBC 4.19 (L) 05/10/2014 0440   HGB 15.6 12/10/2020 0904   HCT 46.9 12/10/2020 0904   PLT 208 12/10/2020 0904   MCV 86 12/10/2020 0904   MCH 28.6 12/10/2020 0904   MCH 28.4 05/10/2014 0440   MCHC 33.3 12/10/2020 0904   MCHC 32.2 05/10/2014 0440   RDW 13.2 12/10/2020 0904   LYMPHSABS 1.3 12/10/2020 0904   MONOABS 0.4 05/08/2014 0043   EOSABS 0.1 12/10/2020 0904   BASOSABS 0.0 12/10/2020 0904    No results found for: "POCLITH", "LITHIUM"   No results found for: "PHENYTOIN", "PHENOBARB", "VALPROATE", "CBMZ"   .res Assessment: Plan:    Treatment Plan/Recommendations:      Greater than 50% of 30 min telephonic interview time with patient was spent on counseling and coordination of care. We discussed his significant improvement with anxiety and depression since switching to Viibryd and would like to continue on this regimen. We discussed concerns about his urinary frequency and advised him to follow up with PCP.  We agreed  today to: Continue  Viibryd 10 mg daily after breakfast.  Must take with food. Will start Trazodone 50 mg daily at bedtime for sleep Will report side effects or worsening symptoms Will follow-up in 4 weeks to reassess Provided emergency contact information Reviewed Ackworth, NP     There are no diagnoses linked to this encounter.  Please see After Visit Summary for patient specific instructions.  No future appointments.  No orders of the defined types were placed in this encounter.     -------------------------------

## 2022-08-23 ENCOUNTER — Other Ambulatory Visit: Payer: Self-pay | Admitting: Behavioral Health

## 2022-08-23 DIAGNOSIS — F411 Generalized anxiety disorder: Secondary | ICD-10-CM

## 2022-09-12 ENCOUNTER — Ambulatory Visit (INDEPENDENT_AMBULATORY_CARE_PROVIDER_SITE_OTHER): Payer: BLUE CROSS/BLUE SHIELD | Admitting: Behavioral Health

## 2022-09-12 ENCOUNTER — Encounter: Payer: Self-pay | Admitting: Behavioral Health

## 2022-09-12 DIAGNOSIS — F411 Generalized anxiety disorder: Secondary | ICD-10-CM | POA: Diagnosis not present

## 2022-09-12 MED ORDER — VILAZODONE HCL 20 MG PO TABS: ORAL_TABLET | ORAL | 3 refills | Status: DC

## 2022-09-12 NOTE — Progress Notes (Signed)
[facility]  Patient ID: Kenneth Hernandez,  MRN: [id number]  PCP: Simone Curia, MD  Date of Evaluation: 09/12/2022 Time spent:30 minutes  Chief Complaint:  Chief Complaint   Anxiety; Depression; Follow-up; Patient Education; Medication Refill     HISTORY/CURRENT STATUS: HPI  "Kenneth Hernandez", 42 year old male presents to this office via telephonic interview for follow up and and medication management. No changes this visit. Says that he likes Viibryd and the medication has made him feel much better. Says it has also made a difference in his relationship with his wife.  Trazodone is helping with sleep  He says that his anxiety today is 3/10, and depression is 1/10.  He is sleeping 7 hours of broken sleep due to getting up frequently to urinate.    He denies any previous history of psychosis, no auditory or visual hallucinations.  No SI or HI.   Past psychiatric medication trials: Viibryd Zoloft Lexapro Depakote Seroquel     Individual Medical History/ Review of Systems: Changes? :No   Allergies: Morphine and Penicillins  Current Medications:  Current Outpatient Medications:    cetirizine (ZYRTEC) 10 MG tablet, Take 10 mg by mouth daily., Disp: , Rfl:    diphenhydrAMINE (BENADRYL) 50 MG tablet, Take 50 mg by mouth at bedtime as needed for itching., Disp: , Rfl:    lisinopril (ZESTRIL) 20 MG tablet, TAKE 1 TABLET BY MOUTH EVERY DAY, Disp: 90 tablet, Rfl: 2   loperamide (IMODIUM) 2 MG capsule, Take by mouth as needed for diarrhea or loose stools., Disp: , Rfl:    magnesium hydroxide (MILK OF MAGNESIA) 400 MG/5ML suspension, Take by mouth daily as needed for mild constipation., Disp: , Rfl:    omeprazole (PRILOSEC) 20 MG capsule, Take 20 mg by mouth 2 (two) times daily before a meal., Disp: , Rfl:    ondansetron (ZOFRAN-ODT) 4 MG disintegrating tablet, Take 1 tablet (4 mg total) by mouth every 8 (eight) hours as needed for nausea or vomiting., Disp: 20 tablet, Rfl: 0   rosuvastatin  (CRESTOR) 20 MG tablet, Take 1 tablet (20 mg total) by mouth daily., Disp: 90 tablet, Rfl: 2   sodium phosphate Pediatric (FLEET) 3.5-9.5 GM/59ML enema, Place 1 enema rectally once., Disp: , Rfl:    traZODone (DESYREL) 50 MG tablet, Take 1 tablet (50 mg total) by mouth at bedtime., Disp: 30 tablet, Rfl: 1   Vilazodone HCl 20 MG TABS, TAKE 1 TABLET(20 MG) BY MOUTH DAILY AFTER BREAKFAST, Disp: 30 tablet, Rfl: 3 Medication Side Effects: none  Family Medical/ Social History: Changes? No  MENTAL HEALTH EXAM:  There were no vitals taken for this visit.There is no height or weight on file to calculate BMI.  General Appearance: Casual, Neat, and Well Groomed  Eye Contact:  Good  Speech:  Clear and Coherent  Volume:  Normal  Mood:  NA  Affect:  Appropriate  Thought Process:  Coherent  Orientation:  Full (Time, Place, and Person)  Thought Content: Logical   Suicidal Thoughts:  No  Homicidal Thoughts:  No  Memory:  WNL  Judgement:  Good  Insight:  Good  Psychomotor Activity:  Normal  Concentration:  Concentration: Good  Recall:  Good  Fund of Knowledge: Good  Language: Good  Assets:  Desire for Improvement  ADL's:  Intact  Cognition: WNL  Prognosis:  Good    DIAGNOSES:    ICD-10-CM   1. Generalized anxiety disorder  F41.1 Vilazodone HCl 20 MG TABS      Receiving Psychotherapy: No  RECOMMENDATIONS:   Greater than 50% of 30 min face to face interview time with patient was spent on counseling and coordination of care. We discussed his significant improvement with anxiety and depression since switching to Viibryd and would like to continue on this regimen. We agreed today to: Continue  Viibryd 20 mg daily after breakfast.  Must take with food. Will start Trazodone 50 mg daily at bedtime for sleep Will report side effects or worsening symptoms Will follow-up in 4 weeks to reassess Provided emergency contact information Reviewed PDMP     Joan Flores, NP

## 2022-10-21 ENCOUNTER — Other Ambulatory Visit: Payer: Self-pay | Admitting: Behavioral Health

## 2022-10-21 DIAGNOSIS — G4709 Other insomnia: Secondary | ICD-10-CM

## 2022-11-07 ENCOUNTER — Ambulatory Visit: Payer: BLUE CROSS/BLUE SHIELD | Admitting: Behavioral Health

## 2022-11-07 DIAGNOSIS — Z0389 Encounter for observation for other suspected diseases and conditions ruled out: Secondary | ICD-10-CM

## 2022-11-08 NOTE — Progress Notes (Signed)
Medication order only

## 2023-01-31 ENCOUNTER — Ambulatory Visit (INDEPENDENT_AMBULATORY_CARE_PROVIDER_SITE_OTHER): Payer: BLUE CROSS/BLUE SHIELD | Admitting: Behavioral Health

## 2023-01-31 ENCOUNTER — Encounter: Payer: Self-pay | Admitting: Behavioral Health

## 2023-01-31 DIAGNOSIS — F411 Generalized anxiety disorder: Secondary | ICD-10-CM | POA: Diagnosis not present

## 2023-01-31 MED ORDER — VILAZODONE HCL 20 MG PO TABS: ORAL_TABLET | ORAL | 3 refills | Status: DC

## 2023-01-31 NOTE — Progress Notes (Signed)
[facility]  Patient ID: Kenneth Hernandez,  MRN: [id number]  PCP: Simone Curia, MD  Date of Evaluation: 01/31/2023 Time spent:30 minutes  Chief Complaint:  Chief Complaint   Depression; Anxiety; Follow-up; Medication Refill; Medication Problem; Patient Education     HISTORY/CURRENT STATUS: HPI "Kenneth Hernandez", 42 year old male presents to this office for follow up and and medication management. No changes this visit. Says he accidentally ran out of Viibryd and has been off the medication for 5 days. He needs refill and will restart medication tomorrow. Says that he likes Viibryd and the medication has made him feel much better. Says it has also made a difference in his relationship with his wife.   He says that his anxiety today is 3/10, and depression is 1/10.  He is sleeping 7 hours of broken sleep due to getting up frequently to urinate.    He denies any previous history of psychosis, no auditory or visual hallucinations.  No SI or HI.   Past psychiatric medication trials: Viibryd Zoloft Lexapro Depakote Seroquel Individual Medical History/ Review of Systems: Changes? :No   Allergies: Morphine and Penicillins  Current Medications:  Current Outpatient Medications:    cetirizine (ZYRTEC) 10 MG tablet, Take 10 mg by mouth daily., Disp: , Rfl:    diphenhydrAMINE (BENADRYL) 50 MG tablet, Take 50 mg by mouth at bedtime as needed for itching., Disp: , Rfl:    lisinopril (ZESTRIL) 20 MG tablet, TAKE 1 TABLET BY MOUTH EVERY DAY, Disp: 90 tablet, Rfl: 2   loperamide (IMODIUM) 2 MG capsule, Take by mouth as needed for diarrhea or loose stools., Disp: , Rfl:    magnesium hydroxide (MILK OF MAGNESIA) 400 MG/5ML suspension, Take by mouth daily as needed for mild constipation., Disp: , Rfl:    omeprazole (PRILOSEC) 20 MG capsule, Take 20 mg by mouth 2 (two) times daily before a meal., Disp: , Rfl:    ondansetron (ZOFRAN-ODT) 4 MG disintegrating tablet, Take 1 tablet (4 mg total) by mouth every  8 (eight) hours as needed for nausea or vomiting., Disp: 20 tablet, Rfl: 0   rosuvastatin (CRESTOR) 20 MG tablet, Take 1 tablet (20 mg total) by mouth daily., Disp: 90 tablet, Rfl: 2   sodium phosphate Pediatric (FLEET) 3.5-9.5 GM/59ML enema, Place 1 enema rectally once., Disp: , Rfl:    traZODone (DESYREL) 50 MG tablet, TAKE 1 TABLET(50 MG) BY MOUTH AT BEDTIME, Disp: 30 tablet, Rfl: 1   Vilazodone HCl 20 MG TABS, TAKE 1 TABLET(20 MG) BY MOUTH DAILY AFTER BREAKFAST, Disp: 30 tablet, Rfl: 3 Medication Side Effects: none  Family Medical/ Social History: Changes? No  MENTAL HEALTH EXAM:  There were no vitals taken for this visit.There is no height or weight on file to calculate BMI.  General Appearance: Casual, Neat, and Well Groomed  Eye Contact:  Good  Speech:  Clear and Coherent  Volume:  Normal  Mood:  NA  Affect:  Appropriate  Thought Process:  Coherent  Orientation:  Full (Time, Place, and Person)  Thought Content: Logical   Suicidal Thoughts:  No  Homicidal Thoughts:  No  Memory:  WNL  Judgement:  Good  Insight:  Good  Psychomotor Activity:  Normal  Concentration:  Concentration: Good  Recall:  Good  Fund of Knowledge: Good  Language: Good  Assets:  Desire for Improvement  ADL's:  Intact  Cognition: WNL  Prognosis:  Good    DIAGNOSES:    ICD-10-CM   1. Generalized anxiety disorder  F41.1 Vilazodone HCl 20  MG TABS      Receiving Psychotherapy: No    RECOMMENDATIONS:   Greater than 50% of 30 min face to face interview time with patient was spent on counseling and coordination of care. We discussed his significant improvement with anxiety and depression since switching to Viibryd and would like to continue on this regimen. We agreed today to: Continue  Viibryd 20 mg daily after breakfast.  Must take with food. Stopped Trazodone 50 mg daily at bedtime for sleep. Pt feeling to groggy next morning.  Will report side effects or worsening symptoms Will follow-up in 12  weeks to reassess Provided emergency contact information Reviewed PDMP     Joan Flores, NP

## 2023-05-02 ENCOUNTER — Ambulatory Visit (INDEPENDENT_AMBULATORY_CARE_PROVIDER_SITE_OTHER): Payer: Self-pay | Admitting: Behavioral Health

## 2023-05-02 DIAGNOSIS — Z0389 Encounter for observation for other suspected diseases and conditions ruled out: Secondary | ICD-10-CM

## 2023-05-02 NOTE — Progress Notes (Signed)
Pt did not show for scheduled appt and did not provide 24 hour notice as required. Additional fees to be assessed.  

## 2023-07-06 ENCOUNTER — Other Ambulatory Visit: Payer: Self-pay | Admitting: Behavioral Health

## 2023-07-06 DIAGNOSIS — F411 Generalized anxiety disorder: Secondary | ICD-10-CM

## 2023-09-15 ENCOUNTER — Other Ambulatory Visit: Payer: Self-pay | Admitting: Behavioral Health

## 2023-09-15 DIAGNOSIS — F411 Generalized anxiety disorder: Secondary | ICD-10-CM

## 2023-09-22 ENCOUNTER — Ambulatory Visit (INDEPENDENT_AMBULATORY_CARE_PROVIDER_SITE_OTHER)

## 2023-09-22 ENCOUNTER — Ambulatory Visit: Payer: Self-pay | Admitting: Podiatry

## 2023-09-22 DIAGNOSIS — M722 Plantar fascial fibromatosis: Secondary | ICD-10-CM | POA: Diagnosis not present

## 2023-09-22 DIAGNOSIS — M778 Other enthesopathies, not elsewhere classified: Secondary | ICD-10-CM | POA: Diagnosis not present

## 2023-09-22 MED ORDER — MELOXICAM 15 MG PO TABS: 15.0000 mg | ORAL_TABLET | Freq: Every day | ORAL | 1 refills | Status: AC

## 2023-09-22 MED ORDER — METHYLPREDNISOLONE 4 MG PO TBPK: ORAL_TABLET | ORAL | 0 refills | Status: AC

## 2023-09-22 NOTE — Progress Notes (Unsigned)
 Chief Complaint  Patient presents with   Right heel pain    NP from [employer], Here today for right heel pain, xrays have been done. The pain is in the pla   HPI: 43 y.o. male presenting today with c/o pain in the bottom of the right heel.  Pain is rated as 8-9/10.  Patient notes pain when getting out of the car.  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  He has pain when getting out of the car after long drives on the bottom of the right heel.  He started working out within the past month and is up to 2 miles walking on the treadmill.  He occasionally puts it at an incline.  Past Medical History:  Diagnosis Date   Adjustment disorder with depressed mood    Body mass index (BMI) of 20.0-20.9 in adult    Diverticulitis 2008   Generalized anxiety disorder    Mixed hyperlipidemia    Past Surgical History:  Procedure Laterality Date   reconstruction of right thumb     Allergies  Allergen Reactions   Morphine Nausea And Vomiting   Penicillins Other (See Comments) and Nausea And Vomiting    unknown Possible rash childhood allergy      Physical Exam: General: The patient is alert and oriented x3 in no acute distress.  Dermatology:  No ecchymosis, erythema, or edema bilateral.  No open lesions.    Vascular: Palpable pedal pulses bilaterally. Capillary refill within normal limits.  No appreciable edema.    Neurological: Epicritic sensation is intact  Musculoskeletal Exam:  There is pain on palpation of the plantarmedial & plantarcentral aspect of right heel.  No gaps or nodules within the plantar fascia.  Positive Windlass mechanism bilateral.  Antalgic gait noted with first few steps upon standing.  No pain on palpation of achilles tendon bilateral.  Ankle df less than 10 degrees with knee extended b/l.  Radiographic Exam (right foot, 3 weightbearing views, 09/22/2023):  Normal osseous mineralization. Joint  spaces preserved.  No fractures noted.  Inferior and posterior calcaneal spurs noted  Assessment/Plan of Care: 1. Plantar fasciitis of right foot   2. Capsulitis of right foot     Meds ordered this encounter  Medications   methylPREDNISolone (MEDROL DOSEPAK) 4 MG TBPK tablet    Sig: Take as directed    Dispense:  21 tablet    Refill:  0   meloxicam (MOBIC) 15 MG tablet    Sig: Take 1 tablet (15 mg total) by mouth daily. Begin taking this once you finish the Medrol Dose pack    Dispense:  30 tablet    Refill:  1   -Reviewed etiology of plantar fasciitis with patient.  Discussed treatment options with patient today, including cortisone injection, NSAID course of treatment, stretching exercises, physical therapy, use of night splint, rest, icing the heel, arch supports/orthotics, and supportive shoe gear.    Patient declined an injection today, so we will start him on a Medrol Dosepak over the next week and once he finishes this, we will have him transition to meloxicam 15 mg 1 tablet p.o. daily for 30 days.  Night splint fitted and dispensed today for the right foot.  This is a static AFO device with soft interface material to be worn when sleeping or nonweightbearing.  Radiation protection practitioner.  Stretching exercises printed at checkout  for him to perform daily  Powerstep inserts dispensed as a courtesy.  Return for 4-6 weeks, or PRN, for right plantar fasciitis f/u.   Joe Murders, DPM, FACFAS [facility]     [address] [place], [place] [address]                Office [phone]  Fax [phone]

## 2023-09-22 NOTE — Patient Instructions (Addendum)
